# Patient Record
Sex: Male | Born: 1950 | Race: White | Hispanic: No | Marital: Married | Smoking: Never smoker
Health system: Southern US, Community
[De-identification: ages and names within clinical notes are randomized; demographics above are authoritative.]

## PROBLEM LIST (undated history)

## (undated) DIAGNOSIS — G4730 Sleep apnea, unspecified: Secondary | ICD-10-CM

## (undated) DIAGNOSIS — M1990 Unspecified osteoarthritis, unspecified site: Secondary | ICD-10-CM

## (undated) DIAGNOSIS — C801 Malignant (primary) neoplasm, unspecified: Secondary | ICD-10-CM

## (undated) DIAGNOSIS — K219 Gastro-esophageal reflux disease without esophagitis: Secondary | ICD-10-CM

## (undated) HISTORY — DX: Gastro-esophageal reflux disease without esophagitis: K21.9

## (undated) HISTORY — PX: TONSILLECTOMY: SUR1361

## (undated) HISTORY — DX: Sleep apnea, unspecified: G47.30

## (undated) HISTORY — PX: OTHER SURGICAL HISTORY: SHX169

## (undated) HISTORY — DX: Malignant (primary) neoplasm, unspecified: C80.1

## (undated) HISTORY — DX: Unspecified osteoarthritis, unspecified site: M19.90

---

## 2010-09-02 ENCOUNTER — Encounter: Payer: Self-pay | Admitting: Family Medicine

## 2010-09-02 ENCOUNTER — Ambulatory Visit (INDEPENDENT_AMBULATORY_CARE_PROVIDER_SITE_OTHER): Payer: BLUE CROSS/BLUE SHIELD | Admitting: Family Medicine

## 2010-09-02 VITALS — BP 140/86 | HR 94 | Ht 69.0 in | Wt 298.0 lb

## 2010-09-02 DIAGNOSIS — Z0000 Encounter for general adult medical examination without abnormal findings: Secondary | ICD-10-CM

## 2010-09-02 MED ORDER — OMEPRAZOLE 40 MG PO CPDR: 40.0000 mg | DELAYED_RELEASE_CAPSULE | Freq: Every day | ORAL | Status: DC

## 2010-09-02 NOTE — Progress Notes (Signed)
  Subjective:    Patient ID: Christopher Blake, male    DOB: [birth year], 60 y.o.   MRN: [id number]  HPI 60 yr old male to re-establish with [place] and for a cpx. He feels fine and has no concerns.He gets good control of his GERD with OTYC Prilosec but it is too expensive.  He knows he is overweight, but he travels a lot on his job. He eats a lot of fast food and gets no exercise.    Review of Systems  Constitutional: Negative.   HENT: Negative.   Eyes: Negative.   Respiratory: Negative.   Cardiovascular: Negative.   Gastrointestinal: Negative.   Genitourinary: Negative.   Musculoskeletal: Negative.   Skin: Negative.   Neurological: Negative.   Hematological: Negative.   Psychiatric/Behavioral: Negative.        Objective:   Physical Exam  Constitutional: He is oriented to person, place, and time. He appears well-developed and well-nourished. No distress.  HENT:  Head: Normocephalic and atraumatic.  Right Ear: External ear normal.  Left Ear: External ear normal.  Nose: Nose normal.  Mouth/Throat: Oropharynx is clear and moist. No oropharyngeal exudate.  Eyes: Conjunctivae and EOM are normal. Pupils are equal, round, and reactive to light. Right eye exhibits no discharge. Left eye exhibits no discharge. No scleral icterus.  Neck: Neck supple. No JVD present. No tracheal deviation present. No thyromegaly present.  Cardiovascular: Normal rate, regular rhythm, normal heart sounds and intact distal pulses.  Exam reveals no gallop and no friction rub.   No murmur heard.      EKG normal  Pulmonary/Chest: Effort normal and breath sounds normal. No respiratory distress. He has no wheezes. He has no rales. He exhibits no tenderness.  Abdominal: Soft. Bowel sounds are normal. He exhibits no distension and no mass. There is no tenderness. There is no rebound and no guarding.  Genitourinary: Rectum normal, prostate normal and penis normal. Guaiac negative stool. No penile tenderness.    Musculoskeletal: Normal range of motion. He exhibits no edema and no tenderness.  Lymphadenopathy:    He has no cervical adenopathy.  Neurological: He is alert and oriented to person, place, and time. He has normal reflexes. No cranial nerve deficit. He exhibits normal muscle tone. Coordination normal.  Skin: Skin is warm and dry. No rash noted. He is not diaphoretic. No erythema. No pallor.  Psychiatric: He has a normal mood and affect. His behavior is normal. Judgment and thought content normal.          Assessment & Plan:  He needs to exercise and lose weight. Wrote for Omeprazole. Recheck the BP in 6 months

## 2010-09-05 ENCOUNTER — Other Ambulatory Visit (INDEPENDENT_AMBULATORY_CARE_PROVIDER_SITE_OTHER): Payer: BLUE CROSS/BLUE SHIELD | Admitting: Family Medicine

## 2010-09-05 DIAGNOSIS — Z0000 Encounter for general adult medical examination without abnormal findings: Secondary | ICD-10-CM

## 2010-09-05 LAB — TSH: TSH: 1.74 u[IU]/mL (ref 0.35–5.50)

## 2010-09-05 LAB — BASIC METABOLIC PANEL
BUN: 17 mg/dL (ref 6–23)
Chloride: 107 mEq/L (ref 96–112)
Creatinine, Ser: 1.1 mg/dL (ref 0.4–1.5)
Glucose, Bld: 94 mg/dL (ref 70–99)
Potassium: 4.9 mEq/L (ref 3.5–5.1)

## 2010-09-05 LAB — CBC WITH DIFFERENTIAL/PLATELET
Basophils Absolute: 0 10*3/uL (ref 0.0–0.1)
Eosinophils Absolute: 0.4 10*3/uL (ref 0.0–0.7)
HCT: 43.7 % (ref 39.0–52.0)
Lymphs Abs: 2.2 10*3/uL (ref 0.7–4.0)
MCV: 88.1 fl (ref 78.0–100.0)
Monocytes Absolute: 0.8 10*3/uL (ref 0.1–1.0)
Neutrophils Relative %: 56.2 % (ref 43.0–77.0)
Platelets: 262 10*3/uL (ref 150.0–400.0)
RDW: 14.4 % (ref 11.5–14.6)

## 2010-09-05 LAB — LIPID PANEL
Cholesterol: 172 mg/dL (ref 0–200)
HDL: 38.4 mg/dL — ABNORMAL LOW (ref >39.00)
LDL Cholesterol: 115 mg/dL — ABNORMAL HIGH (ref 0–99)
Triglycerides: 91 mg/dL (ref 0.0–149.0)
VLDL: 18.2 mg/dL (ref 0.0–40.0)

## 2010-09-05 LAB — POCT URINALYSIS DIPSTICK
Blood, UA: NEGATIVE
Glucose, UA: NEGATIVE
Protein, UA: NEGATIVE
Spec Grav, UA: 1.025
Urobilinogen, UA: 0.2
pH, UA: 5

## 2010-09-05 LAB — HEPATIC FUNCTION PANEL: Total Bilirubin: 0.7 mg/dL (ref 0.3–1.2)

## 2010-09-09 NOTE — Progress Notes (Signed)
LMOM to inform Pt. 

## 2010-11-07 ENCOUNTER — Encounter: Payer: Self-pay | Admitting: Internal Medicine

## 2011-11-29 ENCOUNTER — Other Ambulatory Visit: Payer: Self-pay | Admitting: Family Medicine

## 2011-12-01 NOTE — Telephone Encounter (Signed)
Can we refill this? 

## 2012-01-26 ENCOUNTER — Encounter: Payer: Self-pay | Admitting: Family Medicine

## 2012-01-26 ENCOUNTER — Ambulatory Visit (INDEPENDENT_AMBULATORY_CARE_PROVIDER_SITE_OTHER): Payer: BLUE CROSS/BLUE SHIELD | Admitting: Family Medicine

## 2012-01-26 VITALS — BP 146/90 | HR 110 | Temp 98.5°F | Wt 300.0 lb

## 2012-01-26 DIAGNOSIS — I10 Essential (primary) hypertension: Secondary | ICD-10-CM

## 2012-01-26 MED ORDER — OMEPRAZOLE 40 MG PO CPDR: 40.0000 mg | DELAYED_RELEASE_CAPSULE | Freq: Every day | ORAL | Status: DC

## 2012-01-27 ENCOUNTER — Encounter: Payer: Self-pay | Admitting: Family Medicine

## 2012-01-27 NOTE — Progress Notes (Signed)
  Subjective:    Patient ID: Christopher Blake, male    DOB: 11/09/1950, 61 y.o.   MRN: [id number]  HPI Here to discuss elevated BP lately. He has been seeing his Orthopedist lately for a torn left knee menisus, and he has had several high BP readings in the last 2 weeks. These include a 170/100 and a 157/96. He feels fine with no HA or chest pain or SOB. We have not seen him in over a year.    Review of Systems  Constitutional: Negative.   Respiratory: Negative.   Cardiovascular: Negative.        Objective:   Physical Exam  Constitutional: He appears well-developed and well-nourished.  Neck: No thyromegaly present.  Cardiovascular: Normal rate, regular rhythm, normal heart sounds and intact distal pulses.   Pulmonary/Chest: Effort normal and breath sounds normal.  Lymphadenopathy:    He has no cervical adenopathy.          Assessment & Plan:  New diagnosis of HTN. He is overweight, and he needs to exercise and lose weight. We discussed changing his diet and severely limiting his sodium intake. We will recheck in one month with a cpx and fasting labs. We may initiate medications at that point if the BP is still high.

## 2012-02-23 ENCOUNTER — Other Ambulatory Visit (INDEPENDENT_AMBULATORY_CARE_PROVIDER_SITE_OTHER): Payer: BLUE CROSS/BLUE SHIELD

## 2012-02-23 DIAGNOSIS — Z0000 Encounter for general adult medical examination without abnormal findings: Secondary | ICD-10-CM

## 2012-02-23 LAB — BASIC METABOLIC PANEL
CO2: 23 mEq/L (ref 19–32)
Chloride: 105 mEq/L (ref 96–112)
GFR: 68.96 mL/min (ref >60.00)
Glucose, Bld: 83 mg/dL (ref 70–99)
Potassium: 4.8 mEq/L (ref 3.5–5.1)
Sodium: 136 mEq/L (ref 135–145)

## 2012-02-23 LAB — HEPATIC FUNCTION PANEL
ALT: 26 U/L (ref 0–53)
AST: 24 U/L (ref 0–37)
Albumin: 4 g/dL (ref 3.5–5.2)

## 2012-02-23 LAB — POCT URINALYSIS DIPSTICK
Blood, UA: NEGATIVE
Protein, UA: NEGATIVE
Spec Grav, UA: 1.015
Urobilinogen, UA: 0.2

## 2012-02-23 LAB — CBC WITH DIFFERENTIAL/PLATELET
Basophils Absolute: 0 10*3/uL (ref 0.0–0.1)
Eosinophils Relative: 5.4 % — ABNORMAL HIGH (ref 0.0–5.0)
HCT: 44.3 % (ref 39.0–52.0)
Hemoglobin: 14.4 g/dL (ref 13.0–17.0)
Lymphs Abs: 1.9 10*3/uL (ref 0.7–4.0)
MCV: 89.4 fl (ref 78.0–100.0)
Monocytes Absolute: 0.6 10*3/uL (ref 0.1–1.0)
Monocytes Relative: 10.7 % (ref 3.0–12.0)
Neutro Abs: 2.6 10*3/uL (ref 1.4–7.7)
RDW: 14.7 % — ABNORMAL HIGH (ref 11.5–14.6)

## 2012-02-23 LAB — LIPID PANEL
HDL: 38.1 mg/dL — ABNORMAL LOW (ref >39.00)
Total CHOL/HDL Ratio: 3

## 2012-02-23 LAB — TSH: TSH: 1.55 u[IU]/mL (ref 0.35–5.50)

## 2012-02-24 NOTE — Progress Notes (Signed)
Quick Note:  I spoke with pt ______ 

## 2012-03-02 ENCOUNTER — Encounter: Payer: Self-pay | Admitting: Family Medicine

## 2012-03-02 ENCOUNTER — Ambulatory Visit (INDEPENDENT_AMBULATORY_CARE_PROVIDER_SITE_OTHER): Payer: BLUE CROSS/BLUE SHIELD | Admitting: Family Medicine

## 2012-03-02 VITALS — BP 130/80 | HR 84 | Temp 98.6°F | Ht 69.75 in | Wt 287.0 lb

## 2012-03-02 DIAGNOSIS — Z23 Encounter for immunization: Secondary | ICD-10-CM

## 2012-03-02 DIAGNOSIS — Z0000 Encounter for general adult medical examination without abnormal findings: Secondary | ICD-10-CM

## 2012-03-02 NOTE — Progress Notes (Signed)
  Subjective:    Patient ID: Christopher Blake, male    DOB: 1950-08-28, 61 y.o.   MRN: [id number]  HPI 61 yr old male for a cpx. He feels fine and has no concerns. We met a month ago to discuss his high BP, and he has followed our advice. He has dramatically changed his diet, he has lost 13 lbs, and he feels better.    Review of Systems  Constitutional: Negative.   HENT: Negative.   Eyes: Negative.   Respiratory: Negative.   Cardiovascular: Negative.   Gastrointestinal: Negative.   Genitourinary: Negative.   Musculoskeletal: Negative.   Skin: Negative.   Neurological: Negative.   Hematological: Negative.   Psychiatric/Behavioral: Negative.        Objective:   Physical Exam  Constitutional: He is oriented to person, place, and time. He appears well-developed and well-nourished. No distress.  HENT:  Head: Normocephalic and atraumatic.  Right Ear: External ear normal.  Left Ear: External ear normal.  Nose: Nose normal.  Mouth/Throat: Oropharynx is clear and moist. No oropharyngeal exudate.  Eyes: Conjunctivae normal and EOM are normal. Pupils are equal, round, and reactive to light. Right eye exhibits no discharge. Left eye exhibits no discharge. No scleral icterus.  Neck: Neck supple. No JVD present. No tracheal deviation present. No thyromegaly present.  Cardiovascular: Normal rate, regular rhythm, normal heart sounds and intact distal pulses.  Exam reveals no gallop and no friction rub.   No murmur heard.      EKG normal   Pulmonary/Chest: Effort normal and breath sounds normal. No respiratory distress. He has no wheezes. He has no rales. He exhibits no tenderness.  Abdominal: Soft. Bowel sounds are normal. He exhibits no distension and no mass. There is no tenderness. There is no rebound and no guarding.  Genitourinary: Rectum normal, prostate normal and penis normal. Guaiac negative stool. No penile tenderness.  Musculoskeletal: Normal range of motion. He exhibits no edema and  no tenderness.  Lymphadenopathy:    He has no cervical adenopathy.  Neurological: He is alert and oriented to person, place, and time. He has normal reflexes. No cranial nerve deficit. He exhibits normal muscle tone. Coordination normal.  Skin: Skin is warm and dry. No rash noted. He is not diaphoretic. No erythema. No pallor.  Psychiatric: He has a normal mood and affect. His behavior is normal. Judgment and thought content normal.          Assessment & Plan:  Well exam. I congratulated him on his lifestyle changes. Set up a colonoscopy

## 2013-01-26 ENCOUNTER — Ambulatory Visit (INDEPENDENT_AMBULATORY_CARE_PROVIDER_SITE_OTHER): Payer: BLUE CROSS/BLUE SHIELD

## 2013-01-26 DIAGNOSIS — Z23 Encounter for immunization: Secondary | ICD-10-CM

## 2013-02-24 ENCOUNTER — Other Ambulatory Visit (INDEPENDENT_AMBULATORY_CARE_PROVIDER_SITE_OTHER): Payer: BC Managed Care – PPO

## 2013-02-24 DIAGNOSIS — Z0000 Encounter for general adult medical examination without abnormal findings: Secondary | ICD-10-CM

## 2013-02-24 LAB — BASIC METABOLIC PANEL
CO2: 26 mEq/L (ref 19–32)
Calcium: 9.6 mg/dL (ref 8.4–10.5)
GFR: 64.81 mL/min (ref >60.00)
Sodium: 139 mEq/L (ref 135–145)

## 2013-02-24 LAB — CBC WITH DIFFERENTIAL/PLATELET
Basophils Relative: 0.6 % (ref 0.0–3.0)
Hemoglobin: 15 g/dL (ref 13.0–17.0)
Lymphocytes Relative: 35.2 % (ref 12.0–46.0)
Monocytes Relative: 9.6 % (ref 3.0–12.0)
Neutro Abs: 3.3 10*3/uL (ref 1.4–7.7)
RBC: 5.15 Mil/uL (ref 4.22–5.81)

## 2013-02-24 LAB — HEPATIC FUNCTION PANEL
AST: 21 U/L (ref 0–37)
Albumin: 4.4 g/dL (ref 3.5–5.2)
Alkaline Phosphatase: 60 U/L (ref 39–117)
Total Protein: 6.8 g/dL (ref 6.0–8.3)

## 2013-02-24 LAB — POCT URINALYSIS DIPSTICK
Ketones, UA: NEGATIVE
Protein, UA: NEGATIVE
Spec Grav, UA: 1.02
pH, UA: 5.5

## 2013-02-24 LAB — LIPID PANEL
Cholesterol: 167 mg/dL (ref 0–200)
LDL Cholesterol: 109 mg/dL — ABNORMAL HIGH (ref 0–99)
VLDL: 19.6 mg/dL (ref 0.0–40.0)

## 2013-02-24 NOTE — Progress Notes (Signed)
Quick Note:  Pt has appointment on 03/03/13 will go over then. ______

## 2013-03-03 ENCOUNTER — Encounter: Payer: Self-pay | Admitting: Family Medicine

## 2013-03-03 ENCOUNTER — Ambulatory Visit (INDEPENDENT_AMBULATORY_CARE_PROVIDER_SITE_OTHER): Payer: BC Managed Care – PPO | Admitting: Family Medicine

## 2013-03-03 VITALS — BP 126/70 | HR 90 | Temp 98.6°F | Ht 69.5 in | Wt 286.0 lb

## 2013-03-03 DIAGNOSIS — Z2911 Encounter for prophylactic immunotherapy for respiratory syncytial virus (RSV): Secondary | ICD-10-CM

## 2013-03-03 DIAGNOSIS — Z23 Encounter for immunization: Secondary | ICD-10-CM

## 2013-03-03 DIAGNOSIS — Z0000 Encounter for general adult medical examination without abnormal findings: Secondary | ICD-10-CM

## 2013-03-03 MED ORDER — OMEPRAZOLE 40 MG PO CPDR: 40.0000 mg | DELAYED_RELEASE_CAPSULE | Freq: Every day | ORAL | Status: DC

## 2013-03-03 NOTE — Progress Notes (Signed)
  Subjective:    Patient ID: Christopher Blake, male    DOB: May 18, 1951, 62 y.o.   MRN: [id number]  HPI 62 yr old male for a cpx. He feels well. Since our visit a year ago he has changed his diet and lost 14 lbs. His BP is now normal.    Review of Systems  Constitutional: Negative.   HENT: Negative.   Eyes: Negative.   Respiratory: Negative.   Cardiovascular: Negative.   Gastrointestinal: Negative.   Genitourinary: Negative.   Musculoskeletal: Negative.   Skin: Negative.   Neurological: Negative.   Psychiatric/Behavioral: Negative.        Objective:   Physical Exam  Constitutional: He is oriented to person, place, and time. He appears well-developed and well-nourished. No distress.  HENT:  Head: Normocephalic and atraumatic.  Right Ear: External ear normal.  Left Ear: External ear normal.  Nose: Nose normal.  Mouth/Throat: Oropharynx is clear and moist. No oropharyngeal exudate.  Eyes: Conjunctivae and EOM are normal. Pupils are equal, round, and reactive to light. Right eye exhibits no discharge. Left eye exhibits no discharge. No scleral icterus.  Neck: Neck supple. No JVD present. No tracheal deviation present. No thyromegaly present.  Cardiovascular: Normal rate, regular rhythm, normal heart sounds and intact distal pulses.  Exam reveals no gallop and no friction rub.   No murmur heard. EKG normal   Pulmonary/Chest: Effort normal and breath sounds normal. No respiratory distress. He has no wheezes. He has no rales. He exhibits no tenderness.  Abdominal: Soft. Bowel sounds are normal. He exhibits no distension and no mass. There is no tenderness. There is no rebound and no guarding.  Genitourinary: Rectum normal, prostate normal and penis normal. Guaiac negative stool. No penile tenderness.  Musculoskeletal: Normal range of motion. He exhibits no edema and no tenderness.  Lymphadenopathy:    He has no cervical adenopathy.  Neurological: He is alert and oriented to person,  place, and time. He has normal reflexes. No cranial nerve deficit. He exhibits normal muscle tone. Coordination normal.  Skin: Skin is warm and dry. No rash noted. He is not diaphoretic. No erythema. No pallor.  Psychiatric: He has a normal mood and affect. His behavior is normal. Judgment and thought content normal.          Assessment & Plan:  Well exam. I congratulated him on losing weight. His fasting glucose is up a bit at 109, so I urged him to lose some more. Recheck one year

## 2013-04-25 ENCOUNTER — Encounter: Payer: Self-pay | Admitting: Family Medicine

## 2016-01-03 ENCOUNTER — Encounter: Payer: Self-pay | Admitting: Family Medicine

## 2016-03-11 ENCOUNTER — Telehealth: Payer: Self-pay | Admitting: Family Medicine

## 2016-03-11 NOTE — Telephone Encounter (Signed)
Can you call pt to schedule a medical clearance ( 30 minute visit) ? We received a clearance letter from [facility] and pt must be seen for this first.

## 2016-03-11 NOTE — Telephone Encounter (Signed)
Lmw/w for pt to call back and schedule appt.

## 2016-03-11 NOTE — Telephone Encounter (Signed)
Pt scheduled  

## 2016-03-12 ENCOUNTER — Encounter: Payer: Self-pay | Admitting: Family Medicine

## 2016-03-12 ENCOUNTER — Ambulatory Visit (INDEPENDENT_AMBULATORY_CARE_PROVIDER_SITE_OTHER): Payer: BLUE CROSS/BLUE SHIELD | Admitting: Family Medicine

## 2016-03-12 VITALS — BP 144/96 | HR 78 | Temp 98.6°F | Ht 69.5 in | Wt 296.0 lb

## 2016-03-12 DIAGNOSIS — Z01818 Encounter for other preprocedural examination: Secondary | ICD-10-CM

## 2016-03-12 LAB — POC URINALSYSI DIPSTICK (AUTOMATED)
Bilirubin, UA: NEGATIVE
Blood, UA: NEGATIVE
Glucose, UA: NEGATIVE
Ketones, UA: NEGATIVE
Leukocytes, UA: NEGATIVE
Nitrite, UA: NEGATIVE
Protein, UA: NEGATIVE
SPEC GRAV UA: 1.025
UROBILINOGEN UA: 0.2
pH, UA: 5.5

## 2016-03-12 NOTE — Progress Notes (Signed)
Pre visit review using our clinic review tool, if applicable. No additional management support is needed unless otherwise documented below in the visit note. 

## 2016-03-13 ENCOUNTER — Encounter: Payer: Self-pay | Admitting: Family Medicine

## 2016-03-13 LAB — CBC WITH DIFFERENTIAL/PLATELET
BASOS PCT: 0.2 % (ref 0.0–3.0)
Basophils Absolute: 0 10*3/uL (ref 0.0–0.1)
EOS ABS: 0.4 10*3/uL (ref 0.0–0.7)
Eosinophils Relative: 3.1 % (ref 0.0–5.0)
HCT: 47.1 % (ref 39.0–52.0)
HEMOGLOBIN: 15.7 g/dL (ref 13.0–17.0)
Lymphocytes Relative: 20.7 % (ref 12.0–46.0)
Lymphs Abs: 2.4 10*3/uL (ref 0.7–4.0)
MCHC: 33.4 g/dL (ref 30.0–36.0)
MCV: 88.3 fl (ref 78.0–100.0)
MONO ABS: 0.9 10*3/uL (ref 0.1–1.0)
Monocytes Relative: 7.9 % (ref 3.0–12.0)
Neutro Abs: 8 10*3/uL — ABNORMAL HIGH (ref 1.4–7.7)
Neutrophils Relative %: 68.1 % (ref 43.0–77.0)
PLATELETS: 308 10*3/uL (ref 150.0–400.0)
RBC: 5.34 Mil/uL (ref 4.22–5.81)
RDW: 14.5 % (ref 11.5–15.5)
WBC: 11.7 10*3/uL — AB (ref 4.0–10.5)

## 2016-03-13 LAB — HEPATIC FUNCTION PANEL
ALT: 19 U/L (ref 0–53)
AST: 19 U/L (ref 0–37)
Albumin: 4.5 g/dL (ref 3.5–5.2)
Alkaline Phosphatase: 73 U/L (ref 39–117)
BILIRUBIN TOTAL: 0.3 mg/dL (ref 0.2–1.2)
Bilirubin, Direct: 0.1 mg/dL (ref 0.0–0.3)
TOTAL PROTEIN: 7.4 g/dL (ref 6.0–8.3)

## 2016-03-13 LAB — BASIC METABOLIC PANEL
BUN: 18 mg/dL (ref 6–23)
CHLORIDE: 105 meq/L (ref 96–112)
CO2: 27 meq/L (ref 19–32)
CREATININE: 1.26 mg/dL (ref 0.40–1.50)
Calcium: 10 mg/dL (ref 8.4–10.5)
GFR: 61.24 mL/min (ref >60.00)
GLUCOSE: 93 mg/dL (ref 70–99)
Potassium: 4.3 mEq/L (ref 3.5–5.1)
Sodium: 141 mEq/L (ref 135–145)

## 2016-03-13 LAB — TSH: TSH: 1.69 u[IU]/mL (ref 0.35–4.50)

## 2016-03-13 NOTE — Progress Notes (Signed)
   Subjective:    Patient ID: Christopher Blake, male    DOB: 05-08-1951, 65 y.o.   MRN: [id number]  HPI 65 yr old male to re-establish and for a preoperative exam. He is scheduled for a right knee arthroscopy on 04-02-16 per Dr. Esmond Plants. This is to repair a torn meniscus. Other than knee pain he has no concerns.    Review of Systems  Constitutional: Negative.   HENT: Negative.   Eyes: Negative.   Respiratory: Negative.   Cardiovascular: Negative.   Gastrointestinal: Negative.   Genitourinary: Negative.   Musculoskeletal: Negative.   Skin: Negative.   Neurological: Negative.   Psychiatric/Behavioral: Negative.        Objective:   Physical Exam  Constitutional: He is oriented to person, place, and time. He appears well-developed and well-nourished. No distress.  HENT:  Head: Normocephalic and atraumatic.  Right Ear: External ear normal.  Left Ear: External ear normal.  Nose: Nose normal.  Mouth/Throat: Oropharynx is clear and moist. No oropharyngeal exudate.  Eyes: Conjunctivae and EOM are normal. Pupils are equal, round, and reactive to light. Right eye exhibits no discharge. Left eye exhibits no discharge. No scleral icterus.  Neck: Neck supple. No JVD present. No tracheal deviation present. No thyromegaly present.  Cardiovascular: Normal rate, regular rhythm, normal heart sounds and intact distal pulses.  Exam reveals no gallop and no friction rub.   No murmur heard. EKG is normal   Pulmonary/Chest: Effort normal and breath sounds normal. No respiratory distress. He has no wheezes. He has no rales. He exhibits no tenderness.  Abdominal: Soft. Bowel sounds are normal. He exhibits no distension and no mass. There is no tenderness. There is no rebound and no guarding.  Musculoskeletal: Normal range of motion. He exhibits no edema or tenderness.  Lymphadenopathy:    He has no cervical adenopathy.  Neurological: He is alert and oriented to person, place, and time. He has  normal reflexes. No cranial nerve deficit. He exhibits normal muscle tone. Coordination normal.  Skin: Skin is warm and dry. No rash noted. He is not diaphoretic. No erythema. No pallor.          Assessment & Plan:  Preoperative exam. Although his BP is a little high we will clear him for the procedure, pending the results of labs we will draw today. We discussed the need for him to lose weight. He will return after the procedure to follow up on the BP and for further assessment.  Laurey Morale, MD

## 2016-04-02 HISTORY — PX: KNEE ARTHROSCOPY W/ MENISCAL REPAIR: SHX1877

## 2016-04-21 ENCOUNTER — Encounter: Payer: Self-pay | Admitting: Gastroenterology

## 2016-04-21 ENCOUNTER — Ambulatory Visit (INDEPENDENT_AMBULATORY_CARE_PROVIDER_SITE_OTHER): Payer: BLUE CROSS/BLUE SHIELD | Admitting: Family Medicine

## 2016-04-21 ENCOUNTER — Telehealth: Payer: Self-pay | Admitting: Family Medicine

## 2016-04-21 ENCOUNTER — Other Ambulatory Visit: Payer: Self-pay | Admitting: Family Medicine

## 2016-04-21 ENCOUNTER — Encounter: Payer: Self-pay | Admitting: Family Medicine

## 2016-04-21 VITALS — BP 142/98 | Temp 98.3°F | Ht 69.5 in | Wt 301.0 lb

## 2016-04-21 DIAGNOSIS — N433 Hydrocele, unspecified: Secondary | ICD-10-CM | POA: Diagnosis not present

## 2016-04-21 DIAGNOSIS — K4090 Unilateral inguinal hernia, without obstruction or gangrene, not specified as recurrent: Secondary | ICD-10-CM

## 2016-04-21 DIAGNOSIS — Z0000 Encounter for general adult medical examination without abnormal findings: Secondary | ICD-10-CM | POA: Diagnosis not present

## 2016-04-21 DIAGNOSIS — G4733 Obstructive sleep apnea (adult) (pediatric): Secondary | ICD-10-CM

## 2016-04-21 LAB — PSA: PSA: 0.72 ng/mL (ref 0.10–4.00)

## 2016-04-21 LAB — LIPID PANEL
CHOL/HDL RATIO: 4
CHOLESTEROL: 170 mg/dL (ref 0–200)
HDL: 44.6 mg/dL (ref >39.00)
LDL CALC: 108 mg/dL — AB (ref 0–99)
NonHDL: 125.7
TRIGLYCERIDES: 91 mg/dL (ref 0.0–149.0)
VLDL: 18.2 mg/dL (ref 0.0–40.0)

## 2016-04-21 MED ORDER — OMEPRAZOLE 40 MG PO CPDR: 40.0000 mg | DELAYED_RELEASE_CAPSULE | Freq: Every day | ORAL | 3 refills | Status: DC

## 2016-04-21 NOTE — Addendum Note (Signed)
Addended by: Aggie Hacker A on: 04/21/2016 01:26 PM   Modules accepted: Orders

## 2016-04-21 NOTE — Telephone Encounter (Signed)
Message from [place], we must add a doppler of scrotum to existing order, code is [id number], per Dr. Sarajane Jews okay to order, it is now protocol. I did put in order and spoke with imaging.

## 2016-04-21 NOTE — Progress Notes (Signed)
   Subjective:    Patient ID: Christopher Blake, male    DOB: 05-09-1951, 65 y.o.   MRN: [id number]  HPI 65 yr old male for a well exam. He feels fine but he asks if he may have sleep apnea. His wife says he snores quite loudly and stops breathing for short periods. He had arthroscopy on the right knee on 04-02-16 and this went well except for an infection around some of the sutures. He is taking an antibiotic for 10 days (he does not recall the name of it). He has never had a colonoscopy.    Review of Systems  Constitutional: Negative.   HENT: Negative.   Eyes: Negative.   Respiratory: Negative.   Cardiovascular: Negative.   Gastrointestinal: Negative.   Genitourinary: Negative.   Musculoskeletal: Negative.   Skin: Negative.   Neurological: Negative.   Psychiatric/Behavioral: Negative.        Objective:   Physical Exam  Constitutional: He is oriented to person, place, and time. He appears well-developed and well-nourished. No distress.  HENT:  Head: Normocephalic and atraumatic.  Right Ear: External ear normal.  Left Ear: External ear normal.  Nose: Nose normal.  Mouth/Throat: Oropharynx is clear and moist. No oropharyngeal exudate.  Eyes: Conjunctivae and EOM are normal. Pupils are equal, round, and reactive to light. Right eye exhibits no discharge. Left eye exhibits no discharge. No scleral icterus.  Neck: Neck supple. No JVD present. No tracheal deviation present. No thyromegaly present.  Cardiovascular: Normal rate, regular rhythm, normal heart sounds and intact distal pulses.  Exam reveals no gallop and no friction rub.   No murmur heard. Pulmonary/Chest: Effort normal and breath sounds normal. No respiratory distress. He has no wheezes. He has no rales. He exhibits no tenderness.  Abdominal: Soft. Bowel sounds are normal. He exhibits no distension and no mass. There is no tenderness. There is no rebound and no guarding.  Genitourinary: Rectum normal, prostate normal and  penis normal. Rectal exam shows guaiac negative stool. No penile tenderness.  Genitourinary Comments: The right scrotum has a large fluid collection consistent with a hydrocele. No tenderness. Both testicles are normal.   Musculoskeletal: Normal range of motion. He exhibits no edema or tenderness.  Lymphadenopathy:    He has no cervical adenopathy.  Neurological: He is alert and oriented to person, place, and time. He has normal reflexes. No cranial nerve deficit. He exhibits normal muscle tone. Coordination normal.  Skin: Skin is warm and dry. No rash noted. He is not diaphoretic. No erythema. No pallor.  Psychiatric: He has a normal mood and affect. His behavior is normal. Judgment and thought content normal.          Assessment & Plan:  Well exam. We will supplement the labs he had drawn in October with a lipid panel and a PSA today. Set up a colonoscopy. Refer to Pulmonary for a sleep study. Set up a scrotal US.  Laurey Morale, MD

## 2016-04-21 NOTE — Progress Notes (Signed)
Pre visit review using our clinic review tool, if applicable. No additional management support is needed unless otherwise documented below in the visit note. 

## 2016-04-25 ENCOUNTER — Ambulatory Visit
Admission: RE | Admit: 2016-04-25 | Discharge: 2016-04-25 | Disposition: A | Discharge status: Home / Self Care | Payer: BLUE CROSS/BLUE SHIELD | Source: Ambulatory Visit | Attending: Family Medicine | Admitting: Family Medicine

## 2016-04-25 DIAGNOSIS — N433 Hydrocele, unspecified: Secondary | ICD-10-CM

## 2016-04-28 NOTE — Addendum Note (Signed)
Addended by: Alysia Penna A on: 04/28/2016 05:29 PM   Modules accepted: Orders

## 2016-06-23 ENCOUNTER — Encounter: Payer: Self-pay | Admitting: Pulmonary Disease

## 2016-06-23 ENCOUNTER — Ambulatory Visit (AMBULATORY_SURGERY_CENTER): Payer: Self-pay | Admitting: *Deleted

## 2016-06-23 ENCOUNTER — Ambulatory Visit (INDEPENDENT_AMBULATORY_CARE_PROVIDER_SITE_OTHER): Payer: BLUE CROSS/BLUE SHIELD | Admitting: Pulmonary Disease

## 2016-06-23 VITALS — BP 156/88 | HR 105 | Ht 70.0 in | Wt 304.2 lb

## 2016-06-23 VITALS — Ht 70.0 in | Wt 304.6 lb

## 2016-06-23 DIAGNOSIS — IMO0001 Reserved for inherently not codable concepts without codable children: Secondary | ICD-10-CM

## 2016-06-23 DIAGNOSIS — G4710 Hypersomnia, unspecified: Secondary | ICD-10-CM | POA: Diagnosis not present

## 2016-06-23 DIAGNOSIS — Z1211 Encounter for screening for malignant neoplasm of colon: Secondary | ICD-10-CM

## 2016-06-23 DIAGNOSIS — E669 Obesity, unspecified: Secondary | ICD-10-CM | POA: Diagnosis not present

## 2016-06-23 DIAGNOSIS — Z6841 Body Mass Index (BMI) 40.0 and over, adult: Secondary | ICD-10-CM

## 2016-06-23 MED ORDER — NA SULFATE-K SULFATE-MG SULF 17.5-3.13-1.6 GM/177ML PO SOLN: ORAL | 0 refills | Status: DC

## 2016-06-23 NOTE — Patient Instructions (Signed)

## 2016-06-23 NOTE — Progress Notes (Signed)
   Subjective:    Patient ID: Christopher Blake, male    DOB: 01-27-1951, 66 y.o.   MRN: [id number]  HPI    Review of Systems  Constitutional: Negative.   HENT: Negative.  Negative for congestion, dental problem, ear pain, nosebleeds, postnasal drip, rhinorrhea, sinus pressure, sneezing, sore throat and trouble swallowing.   Eyes: Negative.  Negative for redness and itching.  Respiratory: Negative.  Negative for cough, chest tightness, shortness of breath and wheezing.   Cardiovascular: Negative.  Negative for palpitations and leg swelling.  Gastrointestinal: Negative.  Negative for nausea and vomiting.  Endocrine: Negative.   Genitourinary: Negative.  Negative for dysuria.  Musculoskeletal: Negative.  Negative for joint swelling.  Skin: Negative.  Negative for rash.  Allergic/Immunologic: Negative.   Neurological: Negative.  Negative for headaches.  Hematological: Negative.  Does not bruise/bleed easily.  Psychiatric/Behavioral: Negative.  Negative for dysphoric mood. The patient is not nervous/anxious.        Objective:   Physical Exam        Assessment & Plan:

## 2016-06-23 NOTE — Assessment & Plan Note (Signed)
Weight reduction 

## 2016-06-23 NOTE — Progress Notes (Signed)
Subjective:    Patient ID: Christopher Blake, male    DOB: [birth year], 66 y.o.   MRN: [id number]  HPI   This is the case of Christopher Blake, 66 y.o. Male, who was referred by Dr. Alysia Penna  in consultation regarding OSA.   As you very well know, patient is a non smoker, not known to have asthma or copd. Patient has snoring, gasping, choking, unrefreshed sleep, hypersomnia, fatigue.  Occasional witnessed apneas. Sleeps 5-6 hrs / night. Wakes up unrefreshed in am. Naps in weekend.   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 He travels frequently for work [place] and [place].   (-) abnormal behavior in sleep.   ESS 17.   Review of Systems  Constitutional: Negative.   HENT: Negative.   Eyes: Negative.   Respiratory: Negative.   Cardiovascular: Negative.   Gastrointestinal: Negative.   Endocrine: Negative.   Genitourinary: Negative.   Musculoskeletal: Negative.   Skin: Negative.   Allergic/Immunologic: Negative.   Neurological: Negative.   Hematological: Negative.   Psychiatric/Behavioral: Negative.   All other systems reviewed and are negative.  Past Medical History:  Diagnosis Date  . GERD (gastroesophageal reflux disease)    (-) CAD, CA, DVT  Family History  Problem Relation Age of Onset  . Arthritis Father   . Heart disease Father   . Hypertension Father   . Heart disease Mother   . Diabetes Mother   . COPD Mother   . Colon cancer Neg Hx      Past Surgical History:  Procedure Laterality Date  . KNEE ARTHROSCOPY W/ MENISCAL REPAIR Right 04/02/2016  . TONSILLECTOMY      Social History   Social History  . Marital status: Married    Spouse name: N/A  . Number of children: N/A  . Years of education: N/A   Occupational History  . Not on file.   Social History Main Topics  . Smoking status: Never Smoker  . Smokeless tobacco: Never Used  . Alcohol use No  . Drug use: No  . Sexual activity: Not on file   Other Topics Concern  .  Not on file   Social History Narrative  . No narrative on file   Married with 1 son, works as Engineer, technical sales.   No Known Allergies   Outpatient Medications Prior to Visit  Medication Sig Dispense Refill  . aspirin 325 MG tablet Take 325 mg by mouth as needed.    . fish oil-omega-3 fatty acids 1000 MG capsule Take 2 g by mouth daily.    Marland Kitchen ibuprofen (ADVIL,MOTRIN) 200 MG tablet Take 200 mg by mouth every 6 (six) hours as needed.    . Multiple Vitamin (MULTIVITAMIN) tablet Take 1 tablet by mouth daily.    Marland Kitchen omeprazole (PRILOSEC) 40 MG capsule Take 1 capsule (40 mg total) by mouth daily. 90 capsule 3  . Na Sulfate-K Sulfate-Mg Sulf 17.5-3.13-1.6 GM/180ML SOLN Suprep no substitutions 354 mL 0   No facility-administered medications prior to visit.    No orders of the defined types were placed in this encounter.       Objective:   Physical Exam  Vitals:  Vitals:   06/23/16 1522  BP: (!) 156/88  Pulse: (!) 105  SpO2: 96%  Weight: (!) 304 lb 3.2 oz (138 kg)  Height: 5\' 10"  (1.778 m)    Constitutional/General:  Pleasant, well-nourished, well-developed, not in any distress,  Comfortably seating.  Well kempt  Body mass index is 43.65 kg/m. Wt Readings from Last 3 Encounters:  06/23/16 (!) 304 lb 3.2 oz (138 kg)  06/23/16 (!) 304 lb 9.6 oz (138.2 kg)  04/21/16 (!) 301 lb (136.5 kg)    Neck circumference: 19 in   HEENT: Pupils equal and reactive to light and accommodation. Anicteric sclerae. Normal nasal mucosa.   No oral  lesions,  mouth clear,  oropharynx clear, no postnasal drip. (-) Oral thrush. No dental caries.  Airway - Mallampati class III  Neck: No masses. Midline trachea. No JVD, (-) LAD. (-) bruits appreciated.  Respiratory/Chest: Grossly normal chest. (-) deformity. (-) Accessory muscle use.  Symmetric expansion. (-) Tenderness on palpation.  Resonant on percussion.  Diminished BS on both lower lung zones. (-) wheezing, crackles, rhonchi (-)  egophony  Cardiovascular: Regular rate and  rhythm, heart sounds normal, no murmur or gallops, no peripheral edema  Gastrointestinal:  Normal bowel sounds. Soft, non-tender. No hepatosplenomegaly.  (-) masses.   Musculoskeletal:  Normal muscle tone. Normal gait.   Extremities: Grossly normal. (-) clubbing, cyanosis.  (-) edema  Skin: (-) rash,lesions seen.   Neurological/Psychiatric : alert, oriented to time, place, person. Normal mood and affect           Assessment & Plan:  Hypersomnia As you very well know, patient is a non smoker, not known to have asthma or copd. Patient has snoring, gasping, choking, unrefreshed sleep, hypersomnia, fatigue.  Occasional witnessed apneas. Sleeps 5-6 hrs / night. Wakes up unrefreshed in am. Naps in weekend.   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 He travels frequently for work [place] and [place].   (-) abnormal behavior in sleep.   ESS 17.   Plan :   We discussed about the diagnosis of Obstructive Sleep Apnea (OSA) and implications of untreated OSA. We discussed about CPAP and BiPaP as possible treatment options.    We will schedule the patient for a sleep study. Patient wanted to do a home sleep test. He 66 but is healthy. He plans on working until he is 3. He has friends who use CPAP. Anticipate no issues. Likely moderate. He also travels a lot. May need travel CPAP.   Patient was instructed to call the office if he/she has not heard back from the office 1-2 weeks after the sleep study.   Patient was instructed to call the office if he/she is having issues with the PAP device.   We discussed good sleep hygiene.   Patient was advised not to engage in activities requiring concentration and/or vigilance if he/she is sleepy.  Patient was advised not to drive if he/she is sleepy.    Obesity Weight reduction     Thank you very much for letting me participate in this patient's care. Please do not  hesitate to give me a call if you have any questions or concerns regarding the treatment plan.   Patient will follow up with me in 8-10 weeks.     Monica Becton, MD 06/23/2016   4:17 PM [facility] [place] Pager: [phone] Office: [phone], Fax: [phone]

## 2016-06-23 NOTE — Progress Notes (Signed)
Patient denies allergies to eggs and soy. Patient is not on home O2. No problem with anesthesia. Emmi sent to email.

## 2016-06-23 NOTE — Assessment & Plan Note (Signed)
As you very well know, patient is a non smoker, not known to have asthma or copd. Patient has snoring, gasping, choking, unrefreshed sleep, hypersomnia, fatigue.  Occasional witnessed apneas. Sleeps 5-6 hrs / night. Wakes up unrefreshed in am. Naps in weekend.   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 He travels frequently for work [place] and [place].   (-) abnormal behavior in sleep.   ESS 17.   Plan :   We discussed about the diagnosis of Obstructive Sleep Apnea (OSA) and implications of untreated OSA. We discussed about CPAP and BiPaP as possible treatment options.    We will schedule the patient for a sleep study. Patient wanted to do a home sleep test. He 66 but is healthy. He plans on working until he is 67. He has friends who use CPAP. Anticipate no issues. Likely moderate. He also travels a lot. May need travel CPAP.   Patient was instructed to call the office if he/she has not heard back from the office 1-2 weeks after the sleep study.   Patient was instructed to call the office if he/she is having issues with the PAP device.   We discussed good sleep hygiene.   Patient was advised not to engage in activities requiring concentration and/or vigilance if he/she is sleepy.  Patient was advised not to drive if he/she is sleepy.

## 2016-07-03 DIAGNOSIS — G4733 Obstructive sleep apnea (adult) (pediatric): Secondary | ICD-10-CM | POA: Diagnosis not present

## 2016-07-07 ENCOUNTER — Other Ambulatory Visit: Payer: Self-pay | Admitting: Pulmonary Disease

## 2016-07-07 ENCOUNTER — Encounter: Payer: BLUE CROSS/BLUE SHIELD | Admitting: Gastroenterology

## 2016-07-07 ENCOUNTER — Telehealth: Payer: Self-pay | Admitting: Pulmonary Disease

## 2016-07-07 DIAGNOSIS — G4733 Obstructive sleep apnea (adult) (pediatric): Secondary | ICD-10-CM

## 2016-07-07 NOTE — Telephone Encounter (Signed)
    Please call the pt and tell the pt the [place]  showed OSA   Pt stops breathing 36    times an hour.   Home sleep study was done on : 07/03/16  Please order autoCPAP 5-15 cm H2O. Patient will need a mask fitting session. Patient will need a 1 month download.   Patient needs to be seen by me or any of the NPs/APPs  4-6 weeks after obtaining the cpap machine. Let me know if you receive this.   Thanks!   J. Shirl Harris, MD 07/07/2016, 2:31 PM

## 2016-07-07 NOTE — Telephone Encounter (Signed)
Spoke with patient and informed him of hst results. Pt was informed that he will have a CPAP masking as well as be receiving a CPAP machine. The patient was instructed to contact office once he receives his machine to schedule a follow up appointment. The patient did not have any questions. The orders were placed for the CPAP machine and mask fitting. Nothing further is needed.

## 2016-07-08 DIAGNOSIS — G4733 Obstructive sleep apnea (adult) (pediatric): Secondary | ICD-10-CM

## 2016-07-09 ENCOUNTER — Other Ambulatory Visit: Payer: Self-pay | Admitting: *Deleted

## 2016-07-09 DIAGNOSIS — G4710 Hypersomnia, unspecified: Secondary | ICD-10-CM

## 2016-07-30 ENCOUNTER — Telehealth: Payer: Self-pay | Admitting: Pulmonary Disease

## 2016-07-30 NOTE — Telephone Encounter (Signed)
Spoke with wife informed her the order was sent to [facility] but due to it being after hours will have to follow up with them tomorrow.

## 2016-07-31 NOTE — Telephone Encounter (Signed)
Nira Conn called back - advised that pt is ready to be scheduled and she has tried to contact him - she can be reached [phone] -pr

## 2016-07-31 NOTE — Telephone Encounter (Signed)
Spoke with pt's wife, Gregary Signs. She has been given Heather's contact information at [employer]. Nothing further was needed at this time.

## 2016-07-31 NOTE — Telephone Encounter (Signed)
Called [facility] at [phone] and left VM to find out why pt hasnt been contacted about CPAP start.

## 2016-08-04 ENCOUNTER — Other Ambulatory Visit (HOSPITAL_BASED_OUTPATIENT_CLINIC_OR_DEPARTMENT_OTHER): Payer: BLUE CROSS/BLUE SHIELD

## 2016-08-11 ENCOUNTER — Encounter: Payer: BLUE CROSS/BLUE SHIELD | Admitting: Gastroenterology

## 2016-08-18 ENCOUNTER — Ambulatory Visit: Payer: BLUE CROSS/BLUE SHIELD | Admitting: Pulmonary Disease

## 2016-08-19 ENCOUNTER — Encounter: Payer: Self-pay | Admitting: Gastroenterology

## 2016-08-25 ENCOUNTER — Ambulatory Visit (HOSPITAL_BASED_OUTPATIENT_CLINIC_OR_DEPARTMENT_OTHER): Payer: BLUE CROSS/BLUE SHIELD | Attending: Pulmonary Disease | Admitting: Radiology

## 2016-08-25 DIAGNOSIS — G4733 Obstructive sleep apnea (adult) (pediatric): Secondary | ICD-10-CM

## 2016-09-01 ENCOUNTER — Ambulatory Visit (AMBULATORY_SURGERY_CENTER): Payer: BLUE CROSS/BLUE SHIELD | Admitting: Gastroenterology

## 2016-09-01 ENCOUNTER — Encounter: Payer: Self-pay | Admitting: Gastroenterology

## 2016-09-01 VITALS — BP 132/85 | HR 77 | Temp 97.8°F | Resp 15 | Ht 70.0 in | Wt 304.0 lb

## 2016-09-01 DIAGNOSIS — D126 Benign neoplasm of colon, unspecified: Secondary | ICD-10-CM

## 2016-09-01 DIAGNOSIS — Z1211 Encounter for screening for malignant neoplasm of colon: Secondary | ICD-10-CM | POA: Diagnosis present

## 2016-09-01 DIAGNOSIS — K621 Rectal polyp: Secondary | ICD-10-CM

## 2016-09-01 DIAGNOSIS — K635 Polyp of colon: Secondary | ICD-10-CM

## 2016-09-01 DIAGNOSIS — D128 Benign neoplasm of rectum: Secondary | ICD-10-CM | POA: Diagnosis not present

## 2016-09-01 DIAGNOSIS — D120 Benign neoplasm of cecum: Secondary | ICD-10-CM

## 2016-09-01 DIAGNOSIS — D122 Benign neoplasm of ascending colon: Secondary | ICD-10-CM | POA: Diagnosis not present

## 2016-09-01 DIAGNOSIS — Z1212 Encounter for screening for malignant neoplasm of rectum: Secondary | ICD-10-CM | POA: Diagnosis not present

## 2016-09-01 DIAGNOSIS — D127 Benign neoplasm of rectosigmoid junction: Secondary | ICD-10-CM

## 2016-09-01 DIAGNOSIS — D129 Benign neoplasm of anus and anal canal: Secondary | ICD-10-CM

## 2016-09-01 MED ORDER — SODIUM CHLORIDE 0.9 % IV SOLN: 500.0000 mL | INTRAVENOUS | Status: DC

## 2016-09-01 NOTE — Progress Notes (Signed)
Pt's states no medical or surgical changes since previsit or office visit. 

## 2016-09-01 NOTE — Op Note (Signed)
[facility] Patient Name: Christopher Blake Procedure Date: 09/01/2016 2:09 PM MRN: [id number] Endoscopist: Remo Lipps P. Misael Mcgaha MD, MD Age: 66 Referring MD:  Date of Birth: [birth year] Gender: Male Account #: [id number] Procedure:                Colonoscopy Indications:              Screening for colorectal malignant neoplasm, This                            is the patient's first colonoscopy Medicines:                Monitored Anesthesia Care Procedure:                Pre-Anesthesia Assessment:                           - Prior to the procedure, a History and Physical                            was performed, and patient medications and                            allergies were reviewed. The patient's tolerance of                            previous anesthesia was also reviewed. The risks                            and benefits of the procedure and the sedation                            options and risks were discussed with the patient.                            All questions were answered, and informed consent                            was obtained. Prior Anticoagulants: The patient has                            taken aspirin, last dose was 1 day prior to                            procedure. ASA Grade Assessment: II - A patient                            with mild systemic disease. After reviewing the                            risks and benefits, the patient was deemed in                            satisfactory condition to undergo the procedure.  After obtaining informed consent, the colonoscope                            was passed under direct vision. Throughout the                            procedure, the patient's blood pressure, pulse, and                            oxygen saturations were monitored continuously. The                            Colonoscope was introduced through the anus and                            advanced to the the  cecum, identified by                            appendiceal orifice and ileocecal valve. The                            colonoscopy was performed without difficulty. The                            patient tolerated the procedure well. The quality                            of the bowel preparation was adequate. The                            ileocecal valve, appendiceal orifice, and rectum                            were photographed. Scope In: 2:12:54 PM Scope Out: 2:33:04 PM Scope Withdrawal Time: 0 hours 18 minutes 2 seconds  Total Procedure Duration: 0 hours 20 minutes 10 seconds  Findings:                 The perianal and digital rectal examinations were                            normal.                           A large amount of liquid stool was found in the                            entire colon, making visualization difficult. Time                            was spent to lavage of the colon was performed                            using copious amounts of sterile water, resulting  in clearance with adequate visualization.                           A 4 mm polyp was found in the cecum. The polyp was                            sessile. The polyp was removed with a cold snare.                            Resection and retrieval were complete.                           Five sessile polyps were found in the ascending                            colon. The polyps were 4 to 7 mm in size. These                            polyps were removed with a cold snare. Resection                            and retrieval were complete.                           Three flat polyps were found in the recto-sigmoid                            colon. The polyps were 3 to 4 mm in size. These                            polyps were removed with a cold snare. Resection                            and retrieval were complete.                           A 4 mm polyp was found in the  rectum. The polyp was                            sessile. The polyp was removed with a cold snare.                            Resection and retrieval were complete.                           Many medium-mouthed diverticula were found in the                            entire colon.                           Internal hemorrhoids were found during retroflexion.  The exam was otherwise without abnormality. Complications:            No immediate complications. Estimated blood loss:                            Minimal. Estimated Blood Loss:     Estimated blood loss was minimal. Impression:               - Stool in the entire examined colon. Time taken to                            lavage to achieve adequate views.                           - One 4 mm polyp in the cecum, removed with a cold                            snare. Resected and retrieved.                           - Five 4 to 7 mm polyps in the ascending colon,                            removed with a cold snare. Resected and retrieved.                           - Three 3 to 4 mm polyps at the recto-sigmoid                            colon, removed with a cold snare. Resected and                            retrieved.                           - One 4 mm polyp in the rectum, removed with a cold                            snare. Resected and retrieved.                           - Diverticulosis in the entire examined colon.                           - Internal hemorrhoids.                           - The examination was otherwise normal. Recommendation:           - Patient has a contact number available for                            emergencies. The signs and symptoms of potential  delayed complications were discussed with the                            patient. Return to normal activities tomorrow.                            Written discharge instructions were provided to the                             patient.                           - Resume previous diet.                           - Continue present medications.                           - No aspirin, ibuprofen, naproxen, or other                            non-steroidal anti-inflammatory drugs for 2 weeks                            after polyp removal.                           - Await pathology results.                           - Repeat colonoscopy is recommended for                            surveillance. The colonoscopy date will be                            determined after pathology results from today's                            exam become available for review. Remo Lipps P. Eden Rho MD, MD 09/01/2016 2:40:37 PM This report has been signed electronically.

## 2016-09-01 NOTE — Progress Notes (Signed)
Called to room to assist during endoscopic procedure.  Patient ID and intended procedure confirmed with present staff. Received instructions for my participation in the procedure from the performing physician.  

## 2016-09-01 NOTE — Patient Instructions (Signed)
Colon polyps removed today. Handouts given on polyps,diverticulosis, hemorrhoids. Please do not take any ibuprofen, naproxen, or any other non-steriodal anti-inflammatory medications for 2 weeks!! Result letter in your mail in 2-3 weeks. Call us with any questions or concerns. Thank you!  YOU HAD AN ENDOSCOPIC PROCEDURE TODAY AT [facility]:   Refer to the procedure report that was given to you for any specific questions about what was found during the examination.  If the procedure report does not answer your questions, please call your gastroenterologist to clarify.  If you requested that your care partner not be given the details of your procedure findings, then the procedure report has been included in a sealed envelope for you to review at your convenience later.  YOU SHOULD EXPECT: Some feelings of bloating in the abdomen. Passage of more gas than usual.  Walking can help get rid of the air that was put into your GI tract during the procedure and reduce the bloating. If you had a lower endoscopy (such as a colonoscopy or flexible sigmoidoscopy) you may notice spotting of blood in your stool or on the toilet paper. If you underwent a bowel prep for your procedure, you may not have a normal bowel movement for a few days.  Please Note:  You might notice some irritation and congestion in your nose or some drainage.  This is from the oxygen used during your procedure.  There is no need for concern and it should clear up in a day or so.  SYMPTOMS TO REPORT IMMEDIATELY:   Following lower endoscopy (colonoscopy or flexible sigmoidoscopy):  Excessive amounts of blood in the stool  Significant tenderness or worsening of abdominal pains  Swelling of the abdomen that is new, acute  Fever of 100F or higher   For urgent or emergent issues, a gastroenterologist can be reached at any hour by calling [phone].   DIET:  We do recommend a small meal at first, but then you may  proceed to your regular diet.  Drink plenty of fluids but you should avoid alcoholic beverages for 24 hours.  ACTIVITY:  You should plan to take it easy for the rest of today and you should NOT DRIVE or use heavy machinery until tomorrow (because of the sedation medicines used during the test).    FOLLOW UP: Our staff will call the number listed on your records the next business day following your procedure to check on you and address any questions or concerns that you may have regarding the information given to you following your procedure. If we do not reach you, we will leave a message.  However, if you are feeling well and you are not experiencing any problems, there is no need to return our call.  We will assume that you have returned to your regular daily activities without incident.  If any biopsies were taken you will be contacted by phone or by letter within the next 1-3 weeks.  Please call us at [phone] if you have not heard about the biopsies in 3 weeks.    SIGNATURES/CONFIDENTIALITY: You and/or your care partner have signed paperwork which will be entered into your electronic medical record.  These signatures attest to the fact that that the information above on your After Visit Summary has been reviewed and is understood.  Full responsibility of the confidentiality of this discharge information lies with you and/or your care-partner.

## 2016-09-01 NOTE — Progress Notes (Signed)
To Pacu VSS. Report to RN 

## 2016-09-02 ENCOUNTER — Telehealth: Payer: Self-pay

## 2016-09-02 NOTE — Telephone Encounter (Signed)
  Follow up Call-  Call back number 09/01/2016  Post procedure Call Back phone  # [phone]  Permission to leave phone message Yes  Some recent data might be hidden     Patient questions:  Do you have a fever, pain , or abdominal swelling? No. Pain Score  0 *  Have you tolerated food without any problems? Yes.    Have you been able to return to your normal activities? Yes.    Do you have any questions about your discharge instructions: Diet   No. Medications  No. Follow up visit  No.  Do you have questions or concerns about your Care? No.  Actions: * If pain score is 4 or above: No action needed, pain <4.

## 2016-09-08 ENCOUNTER — Encounter: Payer: Self-pay | Admitting: Gastroenterology

## 2016-10-02 ENCOUNTER — Encounter: Payer: Self-pay | Admitting: Pulmonary Disease

## 2016-10-03 ENCOUNTER — Encounter: Payer: Self-pay | Admitting: Pulmonary Disease

## 2016-10-03 ENCOUNTER — Ambulatory Visit (INDEPENDENT_AMBULATORY_CARE_PROVIDER_SITE_OTHER): Payer: BLUE CROSS/BLUE SHIELD | Admitting: Pulmonary Disease

## 2016-10-03 DIAGNOSIS — G4733 Obstructive sleep apnea (adult) (pediatric): Secondary | ICD-10-CM | POA: Diagnosis not present

## 2016-10-03 NOTE — Patient Instructions (Signed)
   It was a pleasure taking care of you today!  Continue using your CPAP machine.  We will order you a mask fitting session.   Please make sure you use your CPAP device everytime you sleep.  We will monitor the usage of your machine per your insurance requirement.  Your insurance company may take the machine from you if you are not using it regularly.   Please clean the mask, tubings, filter, water reservoir with soapy water every week.  Please use distilled water for the water reservoir.   Please call the office or your machine provider (DME company) if you are having issues with the device.   Return to clinic in 6 weeks   With NP/APP

## 2016-10-03 NOTE — Assessment & Plan Note (Addendum)
As you very well know, patient is a non smoker, not known to have asthma or copd. Patient has snoring, gasping, choking, unrefreshed sleep, hypersomnia, fatigue.  Occasional witnessed apneas. Sleeps 5-6 hrs / night. Wakes up unrefreshed in am. Naps in weekend.   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 He travels frequently for work [place] and [place].   (-) abnormal behavior in sleep.   ESS 17.   Patient had a home sleep study in February which showed AHI of 36. He was started on auto CPAP 5-15 centimeters water. He had a mask fit with Lynnae Sandhoff  and has a full face mask. He is struggling with this mask. He sleeps on his side and  the mask moves a lot. He has a lot of leak issues. Download the last month: 50%, AHI 8.5.  Plan :   We extensively discussed the importance of treating OSA and the need to use PAP therapy.   Continue with autocpap 5-15 cm water. He had a mask fit with Lynnae Sandhoff and he currently has a FFM.  He sleeps on his side a lot and the mask moves a lot and he ends up having a lot of leak. Plan for a mask fitting session with his DME company. We need to get a 1 month download with a new mask and he needs to be compliant. If his AHI remains elevated with the new mask,  He will  need to have a CPAP titration study.    Patient was instructed to have mask, tubings, filter, reservoir cleaned at least once a week with soapy water.  Patient was instructed to call the office if he/she is having issues with the PAP device.    I advised patient to obtain sufficient amount of sleep --  7 to 8 hours at least in a 24 hr period.  Patient was advised to follow good sleep hygiene.  Patient was advised NOT to engage in activities requiring concentration and/or vigilance if he/she is and  sleepy.  Patient is NOT to drive if he/she is sleepy.

## 2016-10-03 NOTE — Progress Notes (Signed)
Subjective:    Patient ID: Christopher Blake, male    DOB: Dec 10, 1950, 66 y.o.   MRN: [id number]  HPI   This is the case of Christopher Blake, 66 y.o. Male, who was referred by Dr. Alysia Penna  in consultation regarding OSA.   As you very well know, patient is a non smoker, not known to have asthma or copd. Patient has snoring, gasping, choking, unrefreshed sleep, hypersomnia, fatigue.  Occasional witnessed apneas. Sleeps 5-6 hrs / night. Wakes up unrefreshed in am. Naps in weekend.   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 He travels frequently for work [place] and [place].   (-) abnormal behavior in sleep.   ESS 17.   ROV 10/03/16 Patient returns to the office as follow-up on his sleep apnea. Since last seen, he had a home sleep study which showed an AHI of 36. He was started on an auto CPAP machine. Download last month: 3%, AHI 8.5. He had a mask fit session with Lynnae Sandhoff. He has a full face mask. The issue with the mask is he usually sleeps on his side and it moves a lot during the night. He isn't comfortable with this mask. Other than that, his sleepiness is better with CPAP.  Review of Systems  Constitutional: Negative.   HENT: Negative.   Eyes: Negative.   Respiratory: Negative.   Cardiovascular: Negative.   Gastrointestinal: Negative.   Endocrine: Negative.   Genitourinary: Negative.   Musculoskeletal: Negative.   Skin: Negative.   Allergic/Immunologic: Negative.   Neurological: Negative.   Hematological: Negative.   Psychiatric/Behavioral: Negative.   All other systems reviewed and are negative.      Objective:   Physical Exam  Vitals:  Vitals:   10/03/16 1020  BP: 128/88  Pulse: 78  SpO2: 96%  Weight: (!) 300 lb 9.6 oz (136.4 kg)  Height: 5\' 10"  (1.778 m)    Constitutional/General:  Pleasant, well-nourished, well-developed, not in any distress,  Comfortably seating.  Well kempt  Body mass index is 43.13 kg/m. Wt Readings from Last 3  Encounters:  10/03/16 (!) 300 lb 9.6 oz (136.4 kg)  09/01/16 (!) 304 lb (137.9 kg)  06/23/16 (!) 304 lb 3.2 oz (138 kg)    Neck circumference: 19 in   HEENT: Pupils equal and reactive to light and accommodation. Anicteric sclerae. Normal nasal mucosa.   No oral  lesions,  mouth clear,  oropharynx clear, no postnasal drip. (-) Oral thrush. No dental caries.  Airway - Mallampati class III  Neck: No masses. Midline trachea. No JVD, (-) LAD. (-) bruits appreciated.  Respiratory/Chest: Grossly normal chest. (-) deformity. (-) Accessory muscle use.  Symmetric expansion. (-) Tenderness on palpation.  Resonant on percussion.  Diminished BS on both lower lung zones. (-) wheezing, crackles, rhonchi (-) egophony  Cardiovascular: Regular rate and  rhythm, heart sounds normal, no murmur or gallops, no peripheral edema  Gastrointestinal:  Normal bowel sounds. Soft, non-tender. No hepatosplenomegaly.  (-) masses.   Musculoskeletal:  Normal muscle tone. Normal gait.   Extremities: Grossly normal. (-) clubbing, cyanosis.  (-) edema  Skin: (-) rash,lesions seen.   Neurological/Psychiatric : alert, oriented to time, place, person. Normal mood and affect           Assessment & Plan:  OSA (obstructive sleep apnea) As you very well know, patient is a non smoker, not known to have asthma or copd. Patient has snoring, gasping, choking, unrefreshed sleep, hypersomnia, fatigue.  Occasional witnessed apneas. Sleeps 5-6 hrs / night. Wakes up unrefreshed in am. Naps in weekend.   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 He travels frequently for work [place] and [place].   (-) abnormal behavior in sleep.   ESS 17.   Patient had a home sleep study in February which showed AHI of 36. He was started on auto CPAP 5-15 centimeters water. He had a mask fit with Lynnae Sandhoff  and has a full face mask. He is struggling with this mask. He sleeps on his side and  the mask moves a  lot. He has a lot of leak issues. Download the last month: 50%, AHI 8.5.  Plan :   We extensively discussed the importance of treating OSA and the need to use PAP therapy.   Continue with autocpap 5-15 cm water. He had a mask fit with Lynnae Sandhoff and he currently has a FFM.  He sleeps on his side a lot and the mask moves a lot and he ends up having a lot of leak. Plan for a mask fitting session with his DME company. We need to get a 1 month download with a new mask and he needs to be compliant. If his AHI remains elevated with the new mask,  He will  need to have a CPAP titration study.    Patient was instructed to have mask, tubings, filter, reservoir cleaned at least once a week with soapy water.  Patient was instructed to call the office if he/she is having issues with the PAP device.    I advised patient to obtain sufficient amount of sleep --  7 to 8 hours at least in a 24 hr period.  Patient was advised to follow good sleep hygiene.  Patient was advised NOT to engage in activities requiring concentration and/or vigilance if he/she is and  sleepy.  Patient is NOT to drive if he/she is sleepy.      Patient will follow up  6 weeks.     Monica Becton, MD 10/03/2016   10:55 AM [facility] [place] Pager: [phone] Office: [phone], Fax: [phone]

## 2016-11-24 ENCOUNTER — Ambulatory Visit (INDEPENDENT_AMBULATORY_CARE_PROVIDER_SITE_OTHER): Payer: BLUE CROSS/BLUE SHIELD | Admitting: Adult Health

## 2016-11-24 ENCOUNTER — Encounter: Payer: Self-pay | Admitting: Adult Health

## 2016-11-24 DIAGNOSIS — G4733 Obstructive sleep apnea (adult) (pediatric): Secondary | ICD-10-CM

## 2016-11-24 DIAGNOSIS — E669 Obesity, unspecified: Secondary | ICD-10-CM | POA: Diagnosis not present

## 2016-11-24 DIAGNOSIS — Z6841 Body Mass Index (BMI) 40.0 and over, adult: Secondary | ICD-10-CM | POA: Diagnosis not present

## 2016-11-24 DIAGNOSIS — IMO0001 Reserved for inherently not codable concepts without codable children: Secondary | ICD-10-CM

## 2016-11-24 NOTE — Progress Notes (Signed)
@Patient  ID: Christopher Blake, male    DOB: 18-Mar-1951, 66 y.o.   MRN: [id number]  Chief Complaint  Patient presents with  . Follow-up    OSA     Referring provider: Laurey Morale, MD  HPI: 66 year old male followed for sleep apnea  TEST  HST 06/2016 AHI 36/hr   11/24/2016 Follow up : OSA  Patient returns for a two-month follow-up. Patient has known severe sleep apnea. Patient is maintained on C Pap at bedtime. Says he is doing well on C Pap and feels rested with no significant daytime sleepiness. Download shows excellent compliance with average usage at 7 hours. Patient is on AutoSet 5-15 cm H2O. AHI 3.5. + leaks.  He did recently get a new mask that is feeling better. Feels much better now .       No Known Allergies  Immunization History  Administered Date(s) Administered  . Influenza Split 03/02/2012  . Influenza,inj,Quad PF,36+ Mos 01/26/2013  . Influenza-Unspecified 02/23/2016  . Zoster 03/03/2013    Past Medical History:  Diagnosis Date  . GERD (gastroesophageal reflux disease)     Tobacco History: History  Smoking Status  . Never Smoker  Smokeless Tobacco  . Never Used   Counseling given: Not Answered   Outpatient Encounter Prescriptions as of 11/24/2016  Medication Sig  . Acetaminophen (TYLENOL 8 HOUR PO) Take by mouth.  Marland Kitchen aspirin 325 MG tablet Take 325 mg by mouth as needed.  . fish oil-omega-3 fatty acids 1000 MG capsule Take 2 g by mouth daily.  Marland Kitchen ibuprofen (ADVIL,MOTRIN) 200 MG tablet Take 200 mg by mouth every 6 (six) hours as needed.  . Multiple Vitamin (MULTIVITAMIN) tablet Take 1 tablet by mouth daily.  Marland Kitchen omeprazole (PRILOSEC) 40 MG capsule Take 1 capsule (40 mg total) by mouth daily.   Facility-Administered Encounter Medications as of 11/24/2016  Medication  . 0.9 %  sodium chloride infusion     Review of Systems  Constitutional:   No  weight loss, night sweats,  Fevers, chills, fatigue, or  lassitude.  HEENT:   No headaches,   Difficulty swallowing,  Tooth/dental problems, or  Sore throat,                No sneezing, itching, ear ache, nasal congestion, post nasal drip,   CV:  No chest pain,  Orthopnea, PND, swelling in lower extremities, anasarca, dizziness, palpitations, syncope.   GI  No heartburn, indigestion, abdominal pain, nausea, vomiting, diarrhea, change in bowel habits, loss of appetite, bloody stools.   Resp: No shortness of breath with exertion or at rest.  No excess mucus, no productive cough,  No non-productive cough,  No coughing up of blood.  No change in color of mucus.  No wheezing.  No chest wall deformity  Skin: no rash or lesions.  GU: no dysuria, change in color of urine, no urgency or frequency.  No flank pain, no hematuria   MS:  No joint pain or swelling.  No decreased range of motion.  No back pain.    Physical Exam  BP 118/78 (BP Location: Left Arm, Patient Position: Sitting, Cuff Size: Large)   Pulse 81   Ht 5\' 10"  (1.778 m)   Wt (!) 303 lb 3.2 oz (137.5 kg)   SpO2 94%   BMI 43.50 kg/m    GEN: A/Ox3; pleasant , NAD , obese    HEENT:  [place]/AT,  EACs-clear, TMs-wnl, NOSE-clear, THROAT-clear, no lesions, no postnasal drip or exudate noted. Class 2-3  MP airway   NECK:  Supple w/ fair ROM; no JVD; normal carotid impulses w/o bruits; no thyromegaly or nodules palpated; no lymphadenopathy.    RESP  Clear  P & A; w/o, wheezes/ rales/ or rhonchi. no accessory muscle use, no dullness to percussion  CARD:  RRR, no m/r/g, no peripheral edema, pulses intact, no cyanosis or clubbing.  GI:   Soft & nt; nml bowel sounds; no organomegaly or masses detected.   Musco: Warm bil, no deformities or joint swelling noted.   Neuro: alert, no focal deficits noted.    Skin: Warm, no lesions or rashes    Lab Results:    BNP No results found for: BNP  ProBNP No results found for: PROBNP  Imaging: No results found.   Assessment & Plan:   OSA (obstructive sleep apnea) Well  controlled on CPAP   Plan  Patient Instructions  Continue on C Pap at bedtime Wear C Pap at least for 6 hours each night. Work on weight loss Do not drive a sleepy Follow-up in one year with Dr. Halford Chessman and As needed      Obesity Wt loss      Rexene Edison, NP 11/24/2016

## 2016-11-24 NOTE — Assessment & Plan Note (Signed)
Well controlled on CPAP   Plan  Patient Instructions  Continue on C Pap at bedtime Wear C Pap at least for 6 hours each night. Work on weight loss Do not drive a sleepy Follow-up in one year with Dr. Halford Blake and As needed

## 2016-11-24 NOTE — Assessment & Plan Note (Signed)
Wt loss  

## 2016-11-24 NOTE — Progress Notes (Signed)
I have reviewed and agree with assessment/plan.  Chesley Mires, MD [facility] 11/24/2016, 12:08 PM Pager:  [phone]

## 2016-11-24 NOTE — Addendum Note (Signed)
Addended by: Parke Poisson E on: 11/24/2016 10:38 AM   Modules accepted: Orders

## 2016-11-24 NOTE — Patient Instructions (Signed)
Continue on C Pap at bedtime Wear C Pap at least for 6 hours each night. Work on weight loss Do not drive a sleepy Follow-up in one year with Dr. Halford Chessman and As needed

## 2017-02-05 ENCOUNTER — Encounter: Payer: Self-pay | Admitting: Family Medicine

## 2017-03-30 ENCOUNTER — Other Ambulatory Visit: Payer: Self-pay | Admitting: Family Medicine

## 2017-04-23 IMAGING — US US ART/VEN ABD/PELV/SCROTUM DOPPLER LTD
1 series · 14 of 25 positions shown · non-contrast
Comparison: None.

CLINICAL DATA: Right-sided scrotal swelling, possible hydrocele

EXAM:
SCROTAL ULTRASOUND
DOPPLER ULTRASOUND OF THE TESTICLES
TECHNIQUE: Complete ultrasound examination of the testicles, epididymis, and
other scrotal structures was performed. Color and spectral Doppler
ultrasound were also utilized to evaluate blood flow to the
testicles.

[Series 1: us art/ven abd/pelv/scrotum doppler ltd · 40 acquisitions, 14 frames shown]
[im 1/40]
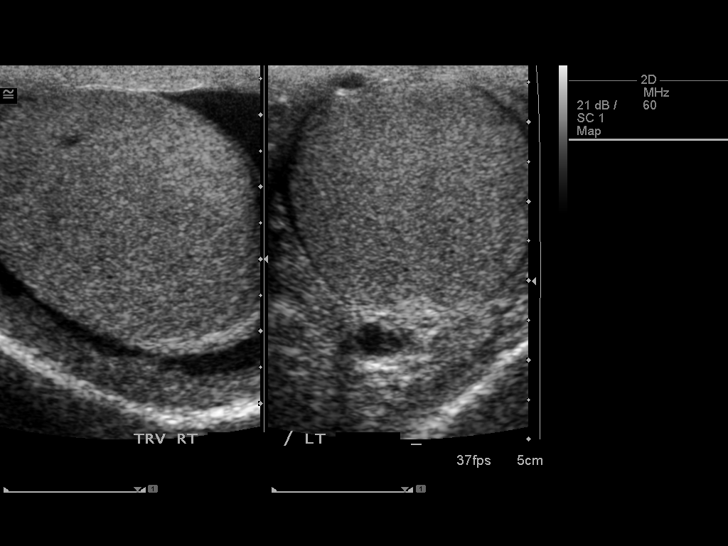
[im 4/40]
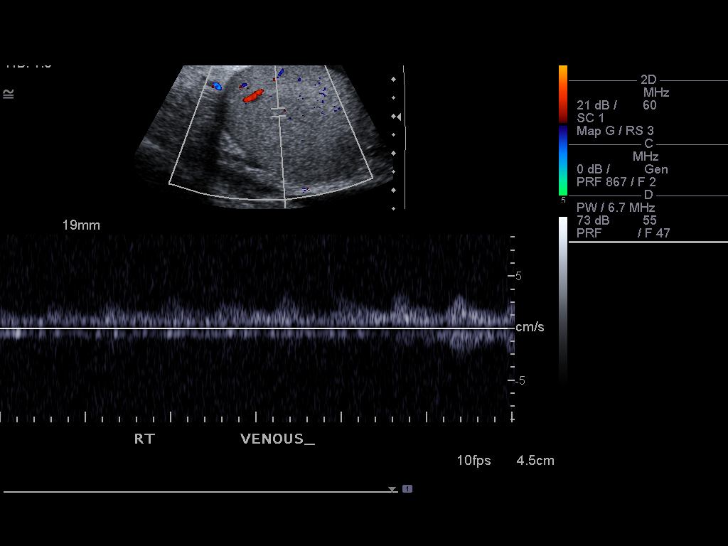
[im 7/40]
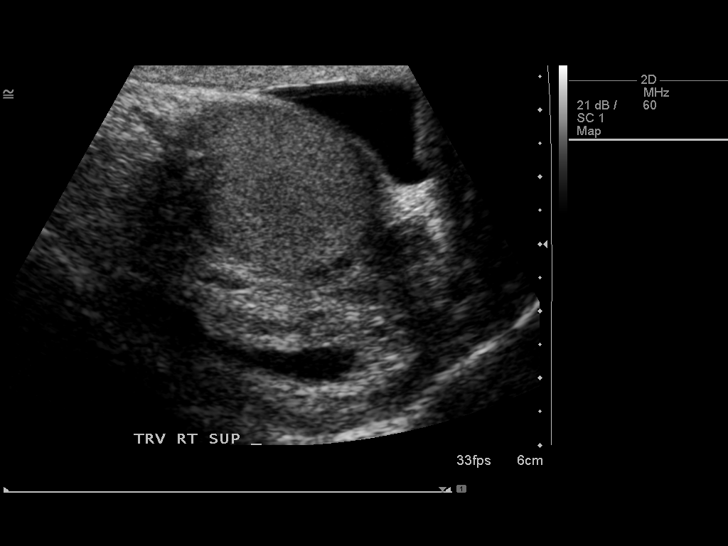
[im 10/40]
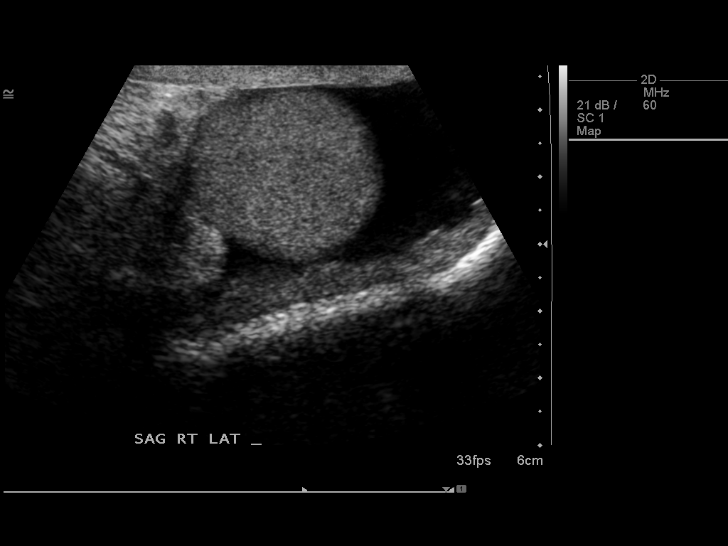
[im 14/40]
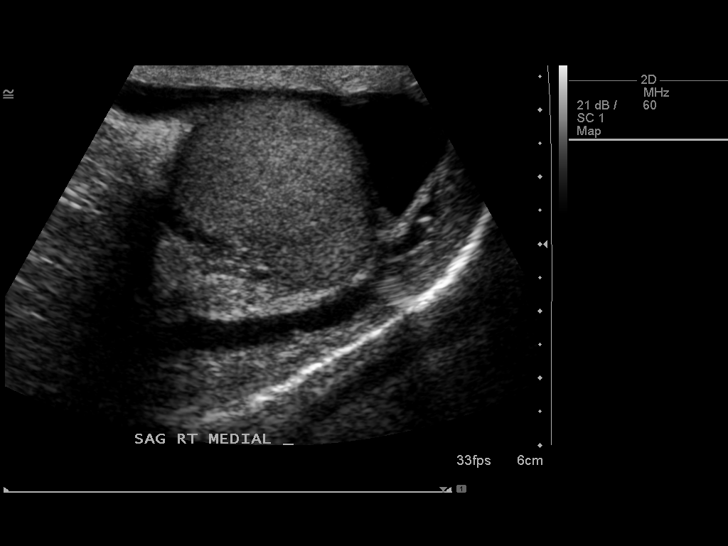
[im 15/40]
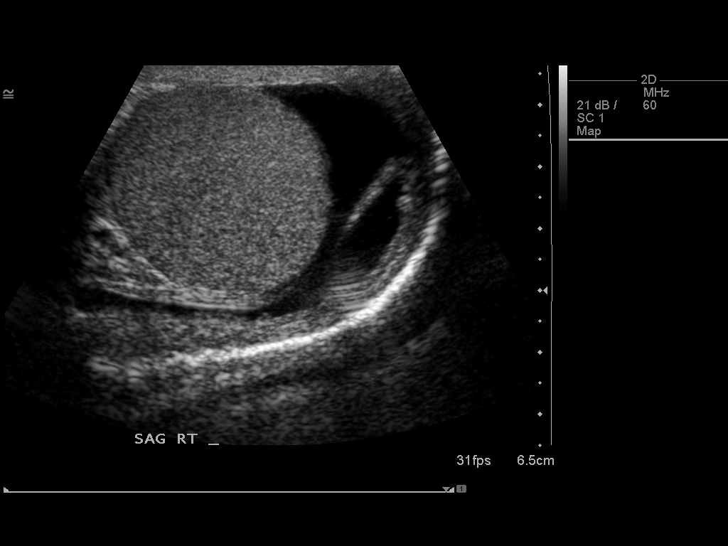
[im 18/40]
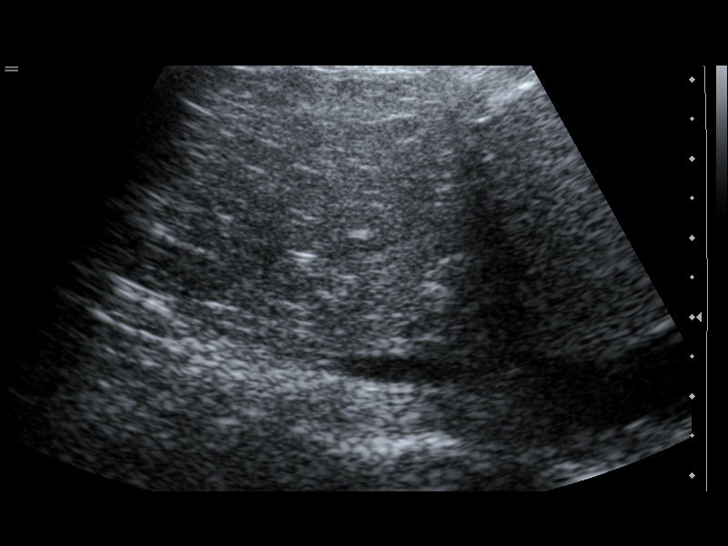
[im 22/40]
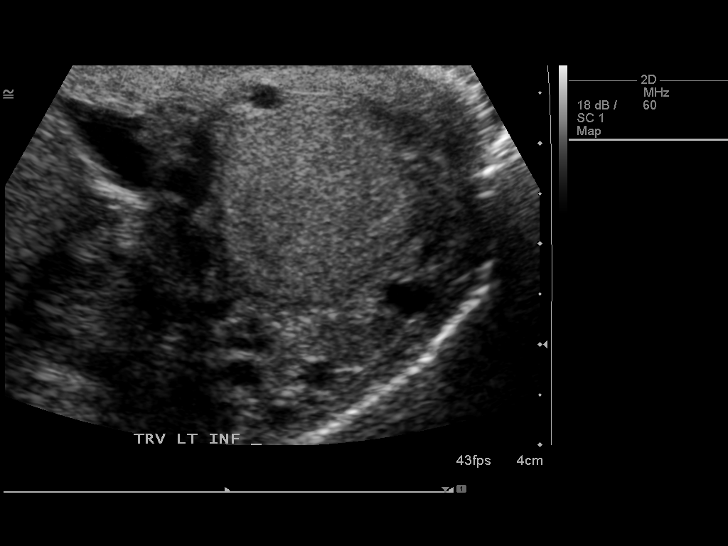
[im 25/40]
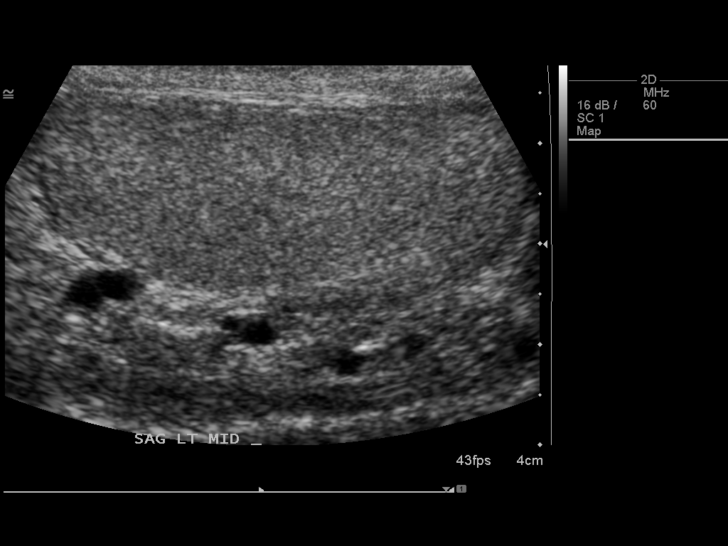
[im 27/40]
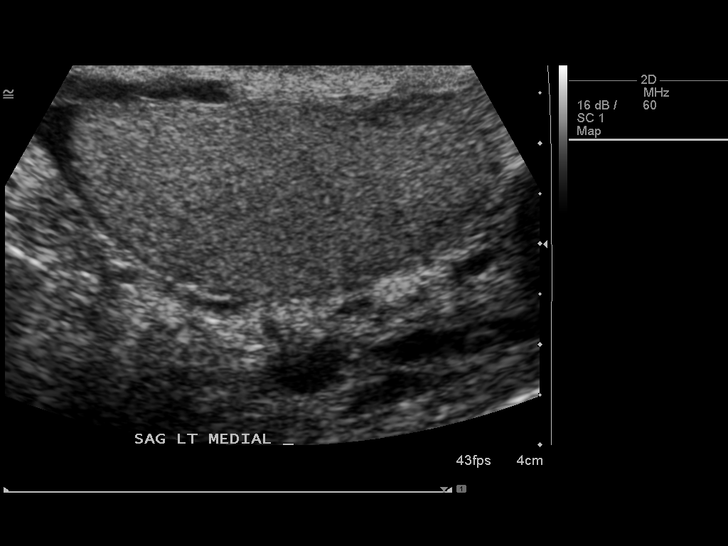
[im 30/40]
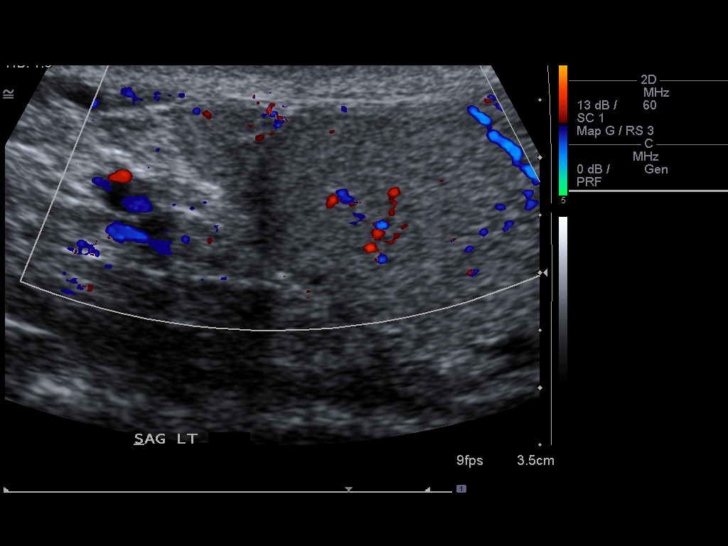
[im 33/40]
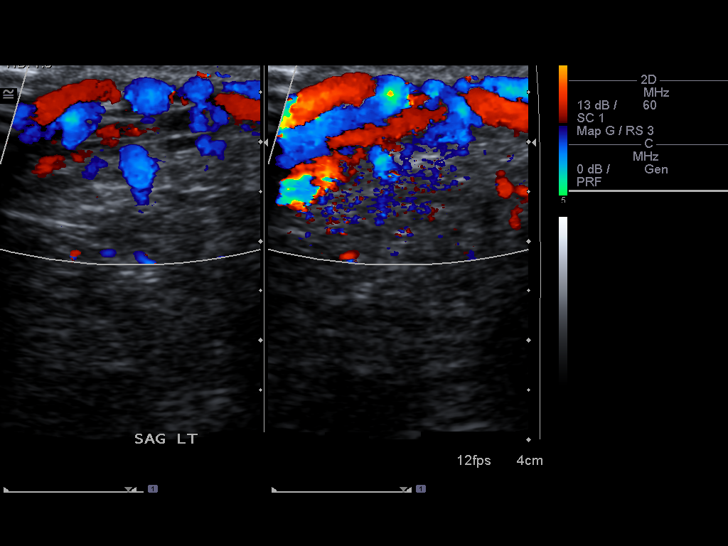
[im 36/40]
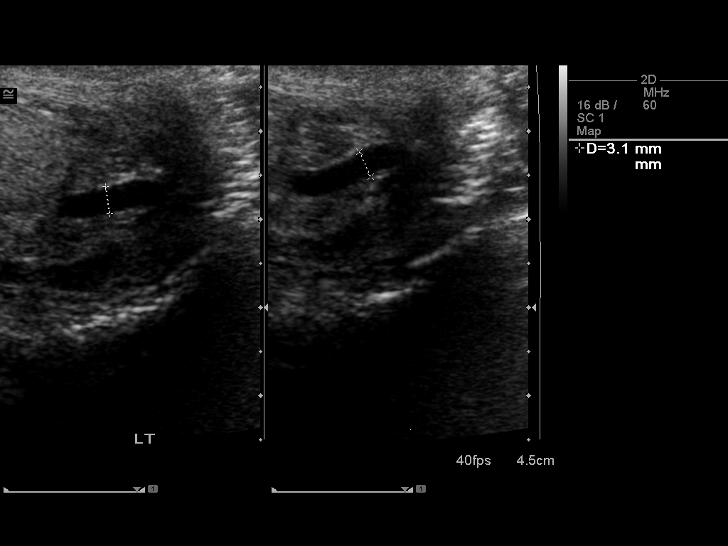
[im 40/40]
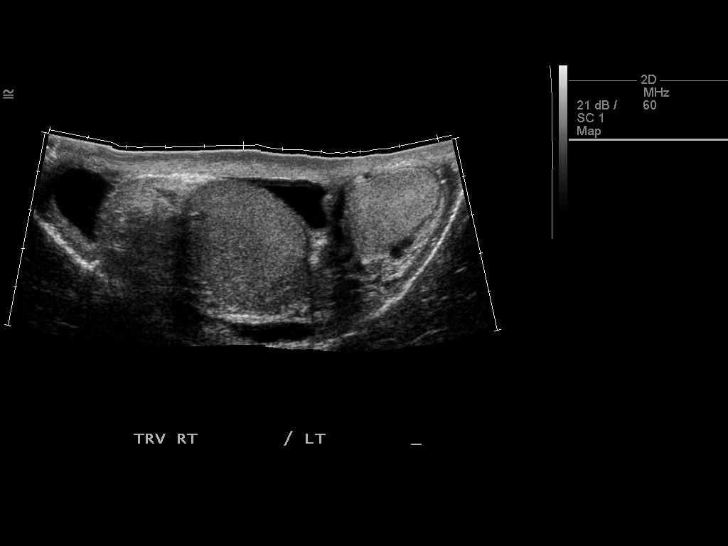

[14 of 25 positions shown; findings below may reference images not displayed]

FINDINGS: Right testicle

Measurements: 3.6 x 3.3 x 3.4 cm.. No mass or microlithiasis
visualized. Area of increased echogenicity is noted superior to the
right testicle suggestive of inguinal hernia.

Left testicle

Measurements: 5.0 x 2.0 x 3.2 cm.. No mass or microlithiasis
visualized.

Right epididymis:  Not well visualized.

Left epididymis:  Normal in size and appearance.

Hydrocele: Small right hydrocele is noted. This is predominantly
inferiorly noted.

Varicocele:  None visualized.

Pulsed Doppler interrogation of both testes demonstrates normal low
resistance arterial and venous waveforms bilaterally.
IMPRESSION: Small right hydrocele.

Findings consistent with a right inguinal hernia which corresponds
to the area of palpable abnormality.

Normal-appearing testicles.

## 2017-08-06 DIAGNOSIS — G4733 Obstructive sleep apnea (adult) (pediatric): Secondary | ICD-10-CM | POA: Diagnosis not present

## 2017-09-03 DIAGNOSIS — C44319 Basal cell carcinoma of skin of other parts of face: Secondary | ICD-10-CM | POA: Diagnosis not present

## 2017-09-03 DIAGNOSIS — D485 Neoplasm of uncertain behavior of skin: Secondary | ICD-10-CM | POA: Diagnosis not present

## 2017-09-03 DIAGNOSIS — L980 Pyogenic granuloma: Secondary | ICD-10-CM | POA: Diagnosis not present

## 2017-09-03 DIAGNOSIS — Z85828 Personal history of other malignant neoplasm of skin: Secondary | ICD-10-CM | POA: Diagnosis not present

## 2017-09-26 ENCOUNTER — Other Ambulatory Visit: Payer: Self-pay | Admitting: Family Medicine

## 2017-09-28 DIAGNOSIS — Z85828 Personal history of other malignant neoplasm of skin: Secondary | ICD-10-CM | POA: Diagnosis not present

## 2017-09-28 DIAGNOSIS — C44319 Basal cell carcinoma of skin of other parts of face: Secondary | ICD-10-CM | POA: Diagnosis not present

## 2017-12-22 DIAGNOSIS — Z85828 Personal history of other malignant neoplasm of skin: Secondary | ICD-10-CM | POA: Diagnosis not present

## 2017-12-22 DIAGNOSIS — I8312 Varicose veins of left lower extremity with inflammation: Secondary | ICD-10-CM | POA: Diagnosis not present

## 2017-12-22 DIAGNOSIS — C44319 Basal cell carcinoma of skin of other parts of face: Secondary | ICD-10-CM | POA: Diagnosis not present

## 2017-12-22 DIAGNOSIS — D485 Neoplasm of uncertain behavior of skin: Secondary | ICD-10-CM | POA: Diagnosis not present

## 2017-12-22 DIAGNOSIS — D1801 Hemangioma of skin and subcutaneous tissue: Secondary | ICD-10-CM | POA: Diagnosis not present

## 2017-12-22 DIAGNOSIS — B078 Other viral warts: Secondary | ICD-10-CM | POA: Diagnosis not present

## 2017-12-22 DIAGNOSIS — L980 Pyogenic granuloma: Secondary | ICD-10-CM | POA: Diagnosis not present

## 2017-12-22 DIAGNOSIS — I872 Venous insufficiency (chronic) (peripheral): Secondary | ICD-10-CM | POA: Diagnosis not present

## 2017-12-22 DIAGNOSIS — L812 Freckles: Secondary | ICD-10-CM | POA: Diagnosis not present

## 2017-12-22 DIAGNOSIS — I8311 Varicose veins of right lower extremity with inflammation: Secondary | ICD-10-CM | POA: Diagnosis not present

## 2018-01-08 ENCOUNTER — Ambulatory Visit: Payer: PPO | Admitting: Pulmonary Disease

## 2018-01-26 DIAGNOSIS — Z85828 Personal history of other malignant neoplasm of skin: Secondary | ICD-10-CM | POA: Diagnosis not present

## 2018-01-26 DIAGNOSIS — C44319 Basal cell carcinoma of skin of other parts of face: Secondary | ICD-10-CM | POA: Diagnosis not present

## 2018-02-27 DIAGNOSIS — Z23 Encounter for immunization: Secondary | ICD-10-CM | POA: Diagnosis not present

## 2018-03-10 ENCOUNTER — Ambulatory Visit (INDEPENDENT_AMBULATORY_CARE_PROVIDER_SITE_OTHER): Payer: PPO | Admitting: Pulmonary Disease

## 2018-03-10 ENCOUNTER — Encounter: Payer: Self-pay | Admitting: Pulmonary Disease

## 2018-03-10 VITALS — BP 112/84 | HR 71 | Ht 68.0 in | Wt 314.4 lb

## 2018-03-10 DIAGNOSIS — E669 Obesity, unspecified: Secondary | ICD-10-CM | POA: Diagnosis not present

## 2018-03-10 DIAGNOSIS — G4730 Sleep apnea, unspecified: Secondary | ICD-10-CM

## 2018-03-10 DIAGNOSIS — G4733 Obstructive sleep apnea (adult) (pediatric): Secondary | ICD-10-CM | POA: Diagnosis not present

## 2018-03-10 NOTE — Patient Instructions (Signed)
Follow up in 1 year.

## 2018-03-10 NOTE — Progress Notes (Signed)
[facility]  Chief Complaint  Patient presents with  . Follow-up    Yearly    Constitutional:  BP 112/84 (BP Location: Right Arm, Patient Position: Sitting, Cuff Size: Normal)   Pulse 71   Ht 5\' 8"  (1.727 m)   Wt (!) 314 lb 6.4 oz (142.6 kg)   SpO2 96%   BMI 47.80 kg/m   Past Medical History:  GERD  Brief Summary:  Christopher Blake is a 67 y.o. male with obstructive sleep apnea.  He was previously seen by Dr. Corrie Dandy.  Home sleep study from 2018 showed severe sleep apnea.  Uses CPAP nightly.  Has nasal cushion mask.  This fits well.  Didn't do well when he tried full face mask.  No issue with pressure.  Denies sinus congestion, sore throat, dry mouth, or aerophagia.  He has trouble keeping up with his diet.  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  He tries to avoid eating after 5 pm.  He has cut down on sodas and sweet tea.   Physical Exam:   Appearance - well kempt  ENMT - clear nasal mucosa, midline nasal septum, no oral exudates, no LAN, trachea midline, MP 3 Respiratory - normal chest wall, normal respiratory effort, no accessory muscle use, no wheeze/rales CV - s1s2 regular rate and rhythm, no murmurs, no peripheral edema, radial pulses symmetric GI - soft, non tender, no masses Lymph - no adenopathy noted in neck and axillary areas MSK - normal muscle strength and tone, normal gait Ext - no cyanosis, clubbing, or joint inflammation noted Skin - no rashes, lesions, or ulcers Neuro - normal strength Psych - normal mood and affect  Assessment/Plan:   Obstructive sleep apnea. - he is compliant with therapy and reports benefit from CPAP - continue auto CPAP  Obesity. - discussed options to assist with weight loss   Patient Instructions  Follow up in 1 year    Chesley Mires, MD [place] Pager: [phone] 03/10/2018, 2:11 PM  Flow Sheet    Sleep tests:  HST 07/03/16 >> AHI 35.6, SpO2 low  85% Auto CPAP 02/08/18 to 03/09/18 >> used on 30 of 30 nights with average 8 hrs 2 min.  Average AHI 2.9 with median CPAP 10 and 95 th percentile CPAP 14 cm H2O.  Medications:   Allergies as of 03/10/2018   No Known Allergies     Medication List        Accurate as of 03/10/18  2:11 PM. Always use your most recent med list.          aspirin 325 MG tablet Take 325 mg by mouth as needed.   fish oil-omega-3 fatty acids 1000 MG capsule Take 2 g by mouth daily.   ibuprofen 200 MG tablet Commonly known as:  ADVIL,MOTRIN Take 200 mg by mouth every 6 (six) hours as needed.   multivitamin tablet Take 1 tablet by mouth daily.   omeprazole 40 MG capsule Commonly known as:  PRILOSEC TAKE 1 CAPSULE DAILY   TYLENOL 8 HOUR PO Take by mouth.       Past Surgical History:  He  has a past surgical history that includes Knee arthroscopy w/ meniscal repair (Right, 04/02/2016) and Tonsillectomy.  Family History:  His family history includes Arthritis in his father; COPD in his mother; Diabetes in his mother; Heart disease in his father and mother; Hypertension in his father.  Social History:  He  reports that he  has never smoked. He has never used smokeless tobacco. He reports that he does not drink alcohol or use drugs.

## 2018-05-04 DIAGNOSIS — G4733 Obstructive sleep apnea (adult) (pediatric): Secondary | ICD-10-CM | POA: Diagnosis not present

## 2018-06-22 DIAGNOSIS — D1801 Hemangioma of skin and subcutaneous tissue: Secondary | ICD-10-CM | POA: Diagnosis not present

## 2018-06-22 DIAGNOSIS — D224 Melanocytic nevi of scalp and neck: Secondary | ICD-10-CM | POA: Diagnosis not present

## 2018-06-22 DIAGNOSIS — L570 Actinic keratosis: Secondary | ICD-10-CM | POA: Diagnosis not present

## 2018-06-22 DIAGNOSIS — Z85828 Personal history of other malignant neoplasm of skin: Secondary | ICD-10-CM | POA: Diagnosis not present

## 2018-06-22 DIAGNOSIS — L812 Freckles: Secondary | ICD-10-CM | POA: Diagnosis not present

## 2018-06-22 DIAGNOSIS — L821 Other seborrheic keratosis: Secondary | ICD-10-CM | POA: Diagnosis not present

## 2018-06-22 DIAGNOSIS — L820 Inflamed seborrheic keratosis: Secondary | ICD-10-CM | POA: Diagnosis not present

## 2018-06-22 DIAGNOSIS — L309 Dermatitis, unspecified: Secondary | ICD-10-CM | POA: Diagnosis not present

## 2018-07-13 DIAGNOSIS — M25561 Pain in right knee: Secondary | ICD-10-CM | POA: Diagnosis not present

## 2018-07-20 ENCOUNTER — Telehealth: Payer: Self-pay | Admitting: Pulmonary Disease

## 2018-07-20 DIAGNOSIS — Z6841 Body Mass Index (BMI) 40.0 and over, adult: Principal | ICD-10-CM

## 2018-07-20 NOTE — Telephone Encounter (Signed)
Called and spoke with Patient.  Patient stated that at his last OV with Dr Halford Chessman, they discussed a nutritionist referral.  Patient stated that he declined then, but he has changed his mind. Patient stated that he is ready to see a nutritionist to help in his weight loss.  Message routed to Dr Halford Chessman

## 2018-07-21 NOTE — Telephone Encounter (Signed)
Okay to put in referral to nutritionist.  Can use diagnosis codes for obstructive sleep apnea and morbid obesity.

## 2018-07-22 NOTE — Telephone Encounter (Signed)
Ok referral placed and pt was made aware.

## 2018-08-05 ENCOUNTER — Ambulatory Visit: Payer: PPO | Admitting: Registered"

## 2018-09-11 DIAGNOSIS — G4733 Obstructive sleep apnea (adult) (pediatric): Secondary | ICD-10-CM | POA: Diagnosis not present

## 2018-12-13 DIAGNOSIS — G4733 Obstructive sleep apnea (adult) (pediatric): Secondary | ICD-10-CM | POA: Diagnosis not present

## 2018-12-17 ENCOUNTER — Other Ambulatory Visit: Payer: Self-pay

## 2019-01-10 DIAGNOSIS — L812 Freckles: Secondary | ICD-10-CM | POA: Diagnosis not present

## 2019-01-10 DIAGNOSIS — D1801 Hemangioma of skin and subcutaneous tissue: Secondary | ICD-10-CM | POA: Diagnosis not present

## 2019-01-10 DIAGNOSIS — D485 Neoplasm of uncertain behavior of skin: Secondary | ICD-10-CM | POA: Diagnosis not present

## 2019-01-10 DIAGNOSIS — C44519 Basal cell carcinoma of skin of other part of trunk: Secondary | ICD-10-CM | POA: Diagnosis not present

## 2019-01-10 DIAGNOSIS — Z85828 Personal history of other malignant neoplasm of skin: Secondary | ICD-10-CM | POA: Diagnosis not present

## 2019-02-08 ENCOUNTER — Encounter: Payer: Self-pay | Admitting: Family Medicine

## 2019-02-08 ENCOUNTER — Ambulatory Visit (INDEPENDENT_AMBULATORY_CARE_PROVIDER_SITE_OTHER): Payer: PPO | Admitting: Family Medicine

## 2019-02-08 ENCOUNTER — Other Ambulatory Visit: Payer: Self-pay

## 2019-02-08 VITALS — BP 122/78 | HR 59 | Temp 97.6°F | Ht 69.0 in | Wt 268.8 lb

## 2019-02-08 DIAGNOSIS — Z23 Encounter for immunization: Secondary | ICD-10-CM | POA: Diagnosis not present

## 2019-02-08 DIAGNOSIS — Z0000 Encounter for general adult medical examination without abnormal findings: Secondary | ICD-10-CM | POA: Diagnosis not present

## 2019-02-08 LAB — HEPATIC FUNCTION PANEL
ALT: 14 U/L (ref 0–53)
AST: 15 U/L (ref 0–37)
Albumin: 4.3 g/dL (ref 3.5–5.2)
Alkaline Phosphatase: 51 U/L (ref 39–117)
Bilirubin, Direct: 0.1 mg/dL (ref 0.0–0.3)
Total Bilirubin: 0.5 mg/dL (ref 0.2–1.2)
Total Protein: 6.5 g/dL (ref 6.0–8.3)

## 2019-02-08 LAB — CBC WITH DIFFERENTIAL/PLATELET
Basophils Absolute: 0 10*3/uL (ref 0.0–0.1)
Basophils Relative: 0.8 % (ref 0.0–3.0)
Eosinophils Absolute: 0.3 10*3/uL (ref 0.0–0.7)
Eosinophils Relative: 4.4 % (ref 0.0–5.0)
HCT: 44.4 % (ref 39.0–52.0)
Hemoglobin: 14.8 g/dL (ref 13.0–17.0)
Lymphocytes Relative: 28.3 % (ref 12.0–46.0)
Lymphs Abs: 1.7 10*3/uL (ref 0.7–4.0)
MCHC: 33.2 g/dL (ref 30.0–36.0)
MCV: 90 fl (ref 78.0–100.0)
Monocytes Absolute: 0.6 10*3/uL (ref 0.1–1.0)
Monocytes Relative: 10.6 % (ref 3.0–12.0)
Neutro Abs: 3.3 10*3/uL (ref 1.4–7.7)
Neutrophils Relative %: 55.9 % (ref 43.0–77.0)
Platelets: 244 10*3/uL (ref 150.0–400.0)
RBC: 4.93 Mil/uL (ref 4.22–5.81)
RDW: 15.4 % (ref 11.5–15.5)
WBC: 5.9 10*3/uL (ref 4.0–10.5)

## 2019-02-08 LAB — BASIC METABOLIC PANEL
BUN: 12 mg/dL (ref 6–23)
CO2: 28 mEq/L (ref 19–32)
Calcium: 9.9 mg/dL (ref 8.4–10.5)
Chloride: 105 mEq/L (ref 96–112)
Creatinine, Ser: 1.07 mg/dL (ref 0.40–1.50)
GFR: 68.95 mL/min (ref >60.00)
Glucose, Bld: 92 mg/dL (ref 70–99)
Potassium: 4.6 mEq/L (ref 3.5–5.1)
Sodium: 140 mEq/L (ref 135–145)

## 2019-02-08 LAB — LIPID PANEL
Cholesterol: 132 mg/dL (ref 0–200)
HDL: 43.7 mg/dL (ref >39.00)
LDL Cholesterol: 73 mg/dL (ref 0–99)
NonHDL: 88.61
Total CHOL/HDL Ratio: 3
Triglycerides: 80 mg/dL (ref 0.0–149.0)
VLDL: 16 mg/dL (ref 0.0–40.0)

## 2019-02-08 LAB — PSA: PSA: 0.74 ng/mL (ref 0.10–4.00)

## 2019-02-08 LAB — TSH: TSH: 1.66 u[IU]/mL (ref 0.35–4.50)

## 2019-02-08 MED ORDER — OMEPRAZOLE 40 MG PO CPDR: 40.0000 mg | DELAYED_RELEASE_CAPSULE | Freq: Every day | ORAL | 3 refills | Status: DC

## 2019-02-08 NOTE — Progress Notes (Signed)
Subjective:    Patient ID: Christopher Blake, male    DOB: 1950-10-30, 68 y.o.   MRN: [id number]  HPI Here for a well exam. He feels well in general. We have not seen him in almost 3 years, but he has made such big dietary changes and he has lost 46 lbs in the past year. He has been seeing an Orthopedist named Dr. Bobette Mo in [place] for right knee pain, and they plan on doing a total replacement sometime soon.    Review of Systems  Constitutional: Negative.   HENT: Negative.   Eyes: Negative.   Respiratory: Negative.   Cardiovascular: Negative.   Gastrointestinal: Negative.   Genitourinary: Negative.   Musculoskeletal: Positive for arthralgias.  Skin: Negative.   Neurological: Negative.   Psychiatric/Behavioral: Negative.        Objective:   Physical Exam Constitutional:      General: He is not in acute distress.    Appearance: He is well-developed. He is not diaphoretic.  HENT:     Head: Normocephalic and atraumatic.     Right Ear: External ear normal.     Left Ear: External ear normal.     Nose: Nose normal.     Mouth/Throat:     Pharynx: No oropharyngeal exudate.  Eyes:     General: No scleral icterus.       Right eye: No discharge.        Left eye: No discharge.     Conjunctiva/sclera: Conjunctivae normal.     Pupils: Pupils are equal, round, and reactive to light.  Neck:     Musculoskeletal: Neck supple.     Thyroid: No thyromegaly.     Vascular: No JVD.     Trachea: No tracheal deviation.  Cardiovascular:     Rate and Rhythm: Normal rate and regular rhythm.     Heart sounds: Normal heart sounds. No murmur. No friction rub. No gallop.   Pulmonary:     Effort: Pulmonary effort is normal. No respiratory distress.     Breath sounds: Normal breath sounds. No wheezing or rales.  Chest:     Chest wall: No tenderness.  Abdominal:     General: Bowel sounds are normal. There is no distension.     Palpations: Abdomen is soft. There is no mass.   Tenderness: There is no abdominal tenderness. There is no guarding or rebound.  Genitourinary:    Penis: Normal. No tenderness.      Scrotum/Testes: Normal.     Prostate: Normal.     Rectum: Normal. Guaiac result negative.  Musculoskeletal: Normal range of motion.        General: No tenderness.  Lymphadenopathy:     Cervical: No cervical adenopathy.  Skin:    General: Skin is warm and dry.     Coloration: Skin is not pale.     Findings: No erythema or rash.  Neurological:     Mental Status: He is alert and oriented to person, place, and time.     Cranial Nerves: No cranial nerve deficit.     Motor: No abnormal muscle tone.     Coordination: Coordination normal.     Deep Tendon Reflexes: Reflexes are normal and symmetric. Reflexes normal.  Psychiatric:        Behavior: Behavior normal.        Thought Content: Thought content normal.        Judgment: Judgment normal.           Assessment &  Plan:  Well exam. We discussed diet and exercise. Get fasting labs.  Alysia Penna, MD

## 2019-02-11 ENCOUNTER — Encounter: Payer: PPO | Admitting: Family Medicine

## 2019-04-13 ENCOUNTER — Other Ambulatory Visit: Payer: Self-pay

## 2019-06-13 DIAGNOSIS — G4733 Obstructive sleep apnea (adult) (pediatric): Secondary | ICD-10-CM | POA: Diagnosis not present

## 2019-06-16 ENCOUNTER — Ambulatory Visit: Payer: PPO

## 2019-06-25 ENCOUNTER — Ambulatory Visit: Payer: PPO

## 2019-07-26 ENCOUNTER — Encounter: Payer: Self-pay | Admitting: Family Medicine

## 2019-08-08 DIAGNOSIS — I8312 Varicose veins of left lower extremity with inflammation: Secondary | ICD-10-CM | POA: Diagnosis not present

## 2019-08-08 DIAGNOSIS — L308 Other specified dermatitis: Secondary | ICD-10-CM | POA: Diagnosis not present

## 2019-08-08 DIAGNOSIS — I872 Venous insufficiency (chronic) (peripheral): Secondary | ICD-10-CM | POA: Diagnosis not present

## 2019-08-08 DIAGNOSIS — Z85828 Personal history of other malignant neoplasm of skin: Secondary | ICD-10-CM | POA: Diagnosis not present

## 2019-08-08 DIAGNOSIS — I8311 Varicose veins of right lower extremity with inflammation: Secondary | ICD-10-CM | POA: Diagnosis not present

## 2019-08-08 DIAGNOSIS — D485 Neoplasm of uncertain behavior of skin: Secondary | ICD-10-CM | POA: Diagnosis not present

## 2019-08-08 DIAGNOSIS — L812 Freckles: Secondary | ICD-10-CM | POA: Diagnosis not present

## 2019-08-08 DIAGNOSIS — D1801 Hemangioma of skin and subcutaneous tissue: Secondary | ICD-10-CM | POA: Diagnosis not present

## 2019-09-13 DIAGNOSIS — G4733 Obstructive sleep apnea (adult) (pediatric): Secondary | ICD-10-CM | POA: Diagnosis not present

## 2019-10-13 ENCOUNTER — Other Ambulatory Visit: Payer: Self-pay

## 2019-10-13 ENCOUNTER — Ambulatory Visit (AMBULATORY_SURGERY_CENTER): Payer: Self-pay | Admitting: *Deleted

## 2019-10-13 VITALS — Ht 69.0 in | Wt 274.0 lb

## 2019-10-13 DIAGNOSIS — Z86010 Personal history of colonic polyps: Secondary | ICD-10-CM

## 2019-10-13 MED ORDER — SUTAB 1479-225-188 MG PO TABS: 1.0000 | ORAL_TABLET | ORAL | 0 refills | Status: DC

## 2019-10-13 NOTE — Progress Notes (Signed)
Patient is here in-person for PV. Patient denies any allergies to eggs or soy. Patient denies any problems with anesthesia/sedation. Patient denies any oxygen use at home. Patient denies taking any diet/weight loss medications or blood thinners. Patient is not being treated for MRSA or C-diff. Patient is aware of our care-partner policy and 0000000 safety protocol. EMMI education assisgned to the patient for the procedure, this was explained and instructions given to patient.   Completed covid vaccines x2 per pt. 07/14/19. Prep Prescription coupon given to the patient.

## 2019-10-25 ENCOUNTER — Encounter: Payer: Self-pay | Admitting: Gastroenterology

## 2019-10-25 ENCOUNTER — Ambulatory Visit (AMBULATORY_SURGERY_CENTER): Payer: PPO | Admitting: Gastroenterology

## 2019-10-25 ENCOUNTER — Other Ambulatory Visit: Payer: Self-pay

## 2019-10-25 VITALS — BP 116/71 | HR 67 | Temp 97.1°F | Resp 14 | Ht 69.0 in | Wt 274.0 lb

## 2019-10-25 DIAGNOSIS — D124 Benign neoplasm of descending colon: Secondary | ICD-10-CM | POA: Diagnosis not present

## 2019-10-25 DIAGNOSIS — Z1211 Encounter for screening for malignant neoplasm of colon: Secondary | ICD-10-CM | POA: Diagnosis not present

## 2019-10-25 DIAGNOSIS — D123 Benign neoplasm of transverse colon: Secondary | ICD-10-CM | POA: Diagnosis not present

## 2019-10-25 DIAGNOSIS — Z86010 Personal history of colonic polyps: Secondary | ICD-10-CM | POA: Diagnosis not present

## 2019-10-25 DIAGNOSIS — D122 Benign neoplasm of ascending colon: Secondary | ICD-10-CM

## 2019-10-25 DIAGNOSIS — K635 Polyp of colon: Secondary | ICD-10-CM | POA: Diagnosis not present

## 2019-10-25 HISTORY — PX: COLONOSCOPY: SHX174

## 2019-10-25 MED ORDER — SODIUM CHLORIDE 0.9 % IV SOLN: 500.0000 mL | Freq: Once | INTRAVENOUS | Status: DC

## 2019-10-25 NOTE — Progress Notes (Signed)
Pt's states no medical or surgical changes since previsit or office visit. 

## 2019-10-25 NOTE — Op Note (Signed)
[facility] Patient Name: Christopher Blake Procedure Date: 10/25/2019 12:55 PM MRN: [id number] Endoscopist: [name] , MD Age: 69 Referring MD:  Date of Birth: [birth year] Gender: Male Account #: [id number] Procedure:                Colonoscopy Indications:              High risk colon cancer surveillance: Personal                            history of colonic polyps Medicines:                Monitored Anesthesia Care Procedure:                Pre-Anesthesia Assessment:                           - Prior to the procedure, a History and Physical                            was performed, and patient medications and                            allergies were reviewed. The patient's tolerance of                            previous anesthesia was also reviewed. The risks                            and benefits of the procedure and the sedation                            options and risks were discussed with the patient.                            All questions were answered, and informed consent                            was obtained. Prior Anticoagulants: The patient has                            taken no previous anticoagulant or antiplatelet                            agents. ASA Grade Assessment: III - A patient with                            severe systemic disease. After reviewing the risks                            and benefits, the patient was deemed in                            satisfactory condition to undergo the procedure.  After obtaining informed consent, the colonoscope                            was passed under direct vision. Throughout the                            procedure, the patient's blood pressure, pulse, and                            oxygen saturations were monitored continuously. The                            Colonoscope was introduced through the anus and                            advanced to the the cecum,  identified by                            appendiceal orifice and ileocecal valve. The                            colonoscopy was performed without difficulty. The                            patient tolerated the procedure well. The quality                            of the bowel preparation was adequate. The                            ileocecal valve, appendiceal orifice, and rectum                            were photographed. Scope In: 1:04:37 PM Scope Out: 1:28:15 PM Scope Withdrawal Time: 0 hours 21 minutes 39 seconds  Total Procedure Duration: 0 hours 23 minutes 38 seconds  Findings:                 The perianal and digital rectal examinations were                            normal.                           Three sessile polyps were found in the ascending                            colon. The polyps were 3 to 6 mm in size. These                            polyps were removed with a cold snare. Resection                            and retrieval were complete.  A 3 mm polyp was found in the splenic flexure. The                            polyp was sessile. The polyp was removed with a                            cold snare. Resection and retrieval were complete.                           A 3 mm polyp was found in the descending colon. The                            polyp was sessile. The polyp was removed with a                            cold snare. Resection and retrieval were complete.                           Multiple medium-mouthed diverticula were found in                            the entire colon.                           Internal hemorrhoids were found during retroflexion.                           The exam was otherwise without abnormality. Complications:            No immediate complications. Estimated blood loss:                            Minimal. Estimated Blood Loss:     Estimated blood loss was minimal. Impression:               - Three 3  to 6 mm polyps in the ascending colon,                            removed with a cold snare. Resected and retrieved.                           - One 3 mm polyp at the splenic flexure, removed                            with a cold snare. Resected and retrieved.                           - One 3 mm polyp in the descending colon, removed                            with a cold snare. Resected and retrieved.                           -  Diverticulosis in the entire examined colon.                           - Internal hemorrhoids.                           - The examination was otherwise normal. Recommendation:           - Patient has a contact number available for                            emergencies. The signs and symptoms of potential                            delayed complications were discussed with the                            patient. Return to normal activities tomorrow.                            Written discharge instructions were provided to the                            patient.                           - Resume previous diet.                           - Continue present medications.                           - Await pathology results. Remo Lipps P. [name], MD 10/25/2019 1:38:22 PM This report has been signed electronically.

## 2019-10-25 NOTE — Patient Instructions (Signed)
Handout on polyps and diverticulosis given    YOU HAD AN ENDOSCOPIC PROCEDURE TODAY AT THE [facility]:   Refer to the procedure report that was given to you for any specific questions about what was found during the examination.  If the procedure report does not answer your questions, please call your gastroenterologist to clarify.  If you requested that your care partner not be given the details of your procedure findings, then the procedure report has been included in a sealed envelope for you to review at your convenience later.  YOU SHOULD EXPECT: Some feelings of bloating in the abdomen. Passage of more gas than usual.  Walking can help get rid of the air that was put into your GI tract during the procedure and reduce the bloating. If you had a lower endoscopy (such as a colonoscopy or flexible sigmoidoscopy) you may notice spotting of blood in your stool or on the toilet paper. If you underwent a bowel prep for your procedure, you may not have a normal bowel movement for a few days.  Please Note:  You might notice some irritation and congestion in your nose or some drainage.  This is from the oxygen used during your procedure.  There is no need for concern and it should clear up in a day or so.  SYMPTOMS TO REPORT IMMEDIATELY:   Following lower endoscopy (colonoscopy or flexible sigmoidoscopy):  Excessive amounts of blood in the stool  Significant tenderness or worsening of abdominal pains  Swelling of the abdomen that is new, acute  Fever of 100F or higher   For urgent or emergent issues, a gastroenterologist can be reached at any hour by calling [phone]. Do not use MyChart messaging for urgent concerns.    DIET:  We do recommend a small meal at first, but then you may proceed to your regular diet.  Drink plenty of fluids but you should avoid alcoholic beverages for 24 hours.  ACTIVITY:  You should plan to take it easy for the rest of today and you should NOT  DRIVE or use heavy machinery until tomorrow (because of the sedation medicines used during the test).    FOLLOW UP: Our staff will call the number listed on your records 48-72 hours following your procedure to check on you and address any questions or concerns that you may have regarding the information given to you following your procedure. If we do not reach you, we will leave a message.  We will attempt to reach you two times.  During this call, we will ask if you have developed any symptoms of COVID 19. If you develop any symptoms (ie: fever, flu-like symptoms, shortness of breath, cough etc.) before then, please call [phone].  If you test positive for Covid 19 in the 2 weeks post procedure, please call and report this information to us.    If any biopsies were taken you will be contacted by phone or by letter within the next 1-3 weeks.  Please call us at [phone] if you have not heard about the biopsies in 3 weeks.    SIGNATURES/CONFIDENTIALITY: You and/or your care partner have signed paperwork which will be entered into your electronic medical record.  These signatures attest to the fact that that the information above on your After Visit Summary has been reviewed and is understood.  Full responsibility of the confidentiality of this discharge information lies with you and/or your care-partner. 

## 2019-10-25 NOTE — Progress Notes (Signed)
Called to room to assist during endoscopic procedure.  Patient ID and intended procedure confirmed with present staff. Received instructions for my participation in the procedure from the performing physician.  

## 2019-10-25 NOTE — Progress Notes (Signed)
Pt Drowsy. VSS. To PACU, report to RN. No anesthetic complications noted.  

## 2019-10-27 ENCOUNTER — Telehealth: Payer: Self-pay | Admitting: *Deleted

## 2019-10-27 NOTE — Telephone Encounter (Signed)
1. Have you developed a fever since your procedure? no  2.   Have you had an respiratory symptoms (SOB or cough) since your procedure? no  3.   Have you tested positive for COVID 19 since your procedure no  4.   Have you had any family members/close contacts diagnosed with the COVID 19 since your procedure?  no   If yes to any of these questions please route to Joylene John, RN and Erenest Rasher, RN Follow up Call-  Call back number 10/25/2019  Post procedure Call Back phone  # [phone]  Permission to leave phone message Yes  Some recent data might be hidden     Patient questions:  Do you have a fever, pain , or abdominal swelling? No. Pain Score  0 *  Have you tolerated food without any problems? Yes.    Have you been able to return to your normal activities? Yes.    Do you have any questions about your discharge instructions: Diet   No. Medications  No. Follow up visit  No.  Do you have questions or concerns about your Care? No.  Actions: * If pain score is 4 or above: No action needed, pain <4.

## 2019-12-16 DIAGNOSIS — G4733 Obstructive sleep apnea (adult) (pediatric): Secondary | ICD-10-CM | POA: Diagnosis not present

## 2020-02-01 DIAGNOSIS — H524 Presbyopia: Secondary | ICD-10-CM | POA: Diagnosis not present

## 2020-02-09 DIAGNOSIS — L570 Actinic keratosis: Secondary | ICD-10-CM | POA: Diagnosis not present

## 2020-02-09 DIAGNOSIS — D1801 Hemangioma of skin and subcutaneous tissue: Secondary | ICD-10-CM | POA: Diagnosis not present

## 2020-02-09 DIAGNOSIS — I872 Venous insufficiency (chronic) (peripheral): Secondary | ICD-10-CM | POA: Diagnosis not present

## 2020-02-09 DIAGNOSIS — Z85828 Personal history of other malignant neoplasm of skin: Secondary | ICD-10-CM | POA: Diagnosis not present

## 2020-02-09 DIAGNOSIS — D225 Melanocytic nevi of trunk: Secondary | ICD-10-CM | POA: Diagnosis not present

## 2020-02-09 DIAGNOSIS — I8312 Varicose veins of left lower extremity with inflammation: Secondary | ICD-10-CM | POA: Diagnosis not present

## 2020-02-09 DIAGNOSIS — L308 Other specified dermatitis: Secondary | ICD-10-CM | POA: Diagnosis not present

## 2020-02-09 DIAGNOSIS — L812 Freckles: Secondary | ICD-10-CM | POA: Diagnosis not present

## 2020-02-09 DIAGNOSIS — I8311 Varicose veins of right lower extremity with inflammation: Secondary | ICD-10-CM | POA: Diagnosis not present

## 2020-02-13 ENCOUNTER — Other Ambulatory Visit: Payer: Self-pay

## 2020-02-13 ENCOUNTER — Encounter: Payer: Self-pay | Admitting: Family Medicine

## 2020-02-13 ENCOUNTER — Ambulatory Visit (INDEPENDENT_AMBULATORY_CARE_PROVIDER_SITE_OTHER): Payer: PPO | Admitting: Family Medicine

## 2020-02-13 VITALS — BP 140/90 | HR 61 | Temp 98.9°F | Ht 69.0 in | Wt 274.6 lb

## 2020-02-13 DIAGNOSIS — Z0000 Encounter for general adult medical examination without abnormal findings: Secondary | ICD-10-CM

## 2020-02-13 DIAGNOSIS — Z23 Encounter for immunization: Secondary | ICD-10-CM

## 2020-02-13 NOTE — Addendum Note (Signed)
Addended by: Matilde Sprang on: 02/13/2020 09:59 AM   Modules accepted: Orders

## 2020-02-13 NOTE — Progress Notes (Signed)
   Subjective:    Patient ID: Christopher Blake, male    DOB: Jul 17, 1950, 69 y.o.   MRN: [id number]  HPI Here for a well exam. He feels fine. He has lost about 45 lbs since last year.    Review of Systems  Constitutional: Negative.   HENT: Negative.   Eyes: Negative.   Respiratory: Negative.   Cardiovascular: Negative.   Gastrointestinal: Negative.   Genitourinary: Negative.   Musculoskeletal: Negative.   Skin: Negative.   Neurological: Negative.   Psychiatric/Behavioral: Negative.        Objective:   Physical Exam Constitutional:      General: He is not in acute distress.    Appearance: He is well-developed. He is not diaphoretic.  HENT:     Head: Normocephalic and atraumatic.     Right Ear: External ear normal.     Left Ear: External ear normal.     Nose: Nose normal.     Mouth/Throat:     Pharynx: No oropharyngeal exudate.  Eyes:     General: No scleral icterus.       Right eye: No discharge.        Left eye: No discharge.     Conjunctiva/sclera: Conjunctivae normal.     Pupils: Pupils are equal, round, and reactive to light.  Neck:     Thyroid: No thyromegaly.     Vascular: No JVD.     Trachea: No tracheal deviation.  Cardiovascular:     Rate and Rhythm: Normal rate and regular rhythm.     Heart sounds: Normal heart sounds. No murmur heard.  No friction rub. No gallop.   Pulmonary:     Effort: Pulmonary effort is normal. No respiratory distress.     Breath sounds: Normal breath sounds. No wheezing or rales.  Chest:     Chest wall: No tenderness.  Abdominal:     General: Bowel sounds are normal. There is no distension.     Palpations: Abdomen is soft. There is no mass.     Tenderness: There is no abdominal tenderness. There is no guarding or rebound.  Genitourinary:    Penis: Normal. No tenderness.      Testes: Normal.     Prostate: Normal.     Rectum: Normal. Guaiac result negative.  Musculoskeletal:        General: No tenderness. Normal range of  motion.     Cervical back: Neck supple.  Lymphadenopathy:     Cervical: No cervical adenopathy.  Skin:    General: Skin is warm and dry.     Coloration: Skin is not pale.     Findings: No erythema or rash.  Neurological:     Mental Status: He is alert and oriented to person, place, and time.     Cranial Nerves: No cranial nerve deficit.     Motor: No abnormal muscle tone.     Coordination: Coordination normal.     Deep Tendon Reflexes: Reflexes are normal and symmetric. Reflexes normal.  Psychiatric:        Behavior: Behavior normal.        Thought Content: Thought content normal.        Judgment: Judgment normal.           Assessment & Plan:  Well exam. We discussed diet and exercise. Get fasting labs.  Alysia Penna, MD

## 2020-02-14 LAB — CBC WITH DIFFERENTIAL/PLATELET
Absolute Monocytes: 674 cells/uL (ref 200–950)
Basophils Absolute: 63 cells/uL (ref 0–200)
Basophils Relative: 1 %
Eosinophils Absolute: 460 cells/uL (ref 15–500)
Eosinophils Relative: 7.3 %
HCT: 47.2 % (ref 38.5–50.0)
Hemoglobin: 15.5 g/dL (ref 13.2–17.1)
Lymphs Abs: 2041 cells/uL (ref 850–3900)
MCH: 29.6 pg (ref 27.0–33.0)
MCHC: 32.8 g/dL (ref 32.0–36.0)
MCV: 90.1 fL (ref 80.0–100.0)
MPV: 10.5 fL (ref 7.5–12.5)
Monocytes Relative: 10.7 %
Neutro Abs: 3062 cells/uL (ref 1500–7800)
Neutrophils Relative %: 48.6 %
Platelets: 290 10*3/uL (ref 140–400)
RBC: 5.24 10*6/uL (ref 4.20–5.80)
RDW: 13.1 % (ref 11.0–15.0)
Total Lymphocyte: 32.4 %
WBC: 6.3 10*3/uL (ref 3.8–10.8)

## 2020-02-14 LAB — BASIC METABOLIC PANEL
BUN: 15 mg/dL (ref 7–25)
CO2: 22 mmol/L (ref 20–32)
Calcium: 10 mg/dL (ref 8.6–10.3)
Chloride: 106 mmol/L (ref 98–110)
Creat: 1.14 mg/dL (ref 0.70–1.25)
Glucose, Bld: 105 mg/dL — ABNORMAL HIGH (ref 65–99)
Potassium: 4.2 mmol/L (ref 3.5–5.3)
Sodium: 139 mmol/L (ref 135–146)

## 2020-02-14 LAB — LIPID PANEL
Cholesterol: 158 mg/dL (ref <200)
HDL: 50 mg/dL (ref >=40)
LDL Cholesterol (Calc): 89 mg/dL (calc)
Non-HDL Cholesterol (Calc): 108 mg/dL (calc) (ref <130)
Total CHOL/HDL Ratio: 3.2 (calc) (ref <5.0)
Triglycerides: 92 mg/dL (ref <150)

## 2020-02-14 LAB — HEPATIC FUNCTION PANEL
AG Ratio: 1.9 (calc) (ref 1.0–2.5)
ALT: 14 U/L (ref 9–46)
AST: 19 U/L (ref 10–35)
Albumin: 4.5 g/dL (ref 3.6–5.1)
Alkaline phosphatase (APISO): 49 U/L (ref 35–144)
Bilirubin, Direct: 0.2 mg/dL (ref 0.0–0.2)
Globulin: 2.4 g/dL (calc) (ref 1.9–3.7)
Indirect Bilirubin: 0.5 mg/dL (calc) (ref 0.2–1.2)
Total Bilirubin: 0.7 mg/dL (ref 0.2–1.2)
Total Protein: 6.9 g/dL (ref 6.1–8.1)

## 2020-02-14 LAB — PSA: PSA: 0.61 ng/mL (ref <=4.0)

## 2020-02-14 LAB — TSH: TSH: 1.68 mIU/L (ref 0.40–4.50)

## 2020-03-22 DIAGNOSIS — G4733 Obstructive sleep apnea (adult) (pediatric): Secondary | ICD-10-CM | POA: Diagnosis not present

## 2020-05-03 ENCOUNTER — Telehealth: Payer: Self-pay | Admitting: Family Medicine

## 2020-05-03 NOTE — Telephone Encounter (Signed)
Left message for patient to call back and schedule Medicare Annual Wellness Visit (AWVI) either virtually or in office.   Last AWV no information  please schedule at anytime with [facility] Nurse Health Advisor 1 or 2   This should be a 45 minute visit.

## 2020-06-26 DIAGNOSIS — G4733 Obstructive sleep apnea (adult) (pediatric): Secondary | ICD-10-CM | POA: Diagnosis not present

## 2020-07-27 NOTE — Progress Notes (Signed)
Subjective:   Christopher Blake is a 70 y.o. male who presents for an Initial Medicare Annual Wellness Visit.  Virtual Visit via Video Note  I connected with Christopher Blake by a video enabled telemedicine application and verified that I am speaking with the correct person using two identifiers.  Location: Patient: Home Provider: Office Persons participating in the virtual visit: patient, provider   I discussed the limitations of evaluation and management by telemedicine and the availability of in person appointments. The patient expressed understanding and agreed to proceed.     Young Harris    Review of Systems    N/A  Cardiac Risk Factors include: advanced age (>61men, >71 women)     Objective:    Today's Vitals   There is no height or weight on file to calculate BMI.  Advanced Directives 07/30/2020 09/01/2016 06/23/2016  Does Patient Have a Medical Advance Directive? Yes No No  Type of Paramedic of [place];Living will - -  Does patient want to make changes to medical advance directive? No - Patient declined - -  Copy of [place] in Chart? No - copy requested - -    Current Medications (verified) Outpatient Encounter Medications as of 07/30/2020  Medication Sig  . Acetaminophen (TYLENOL 8 HOUR PO) Take by mouth.  . APPLE CIDER VINEGAR PO Take by mouth.  Marland Kitchen aspirin 325 MG tablet Take 325 mg by mouth as needed.  . Cyanocobalamin (VITAMIN B 12 PO) Take by mouth.  Marland Kitchen ibuprofen (ADVIL,MOTRIN) 200 MG tablet Take 200 mg by mouth every 6 (six) hours as needed.  . Multiple Vitamin (MULTIVITAMIN) tablet Take 1 tablet by mouth daily.  . NON FORMULARY CBD oil po daily  . VITAMIN D PO Take by mouth.  . zinc gluconate 50 MG tablet Take 50 mg by mouth daily.   No facility-administered encounter medications on file as of 07/30/2020.    Allergies (verified) Patient has no known allergies.   History: Past Medical History:   Diagnosis Date  . Arthritis    right knee  . Cancer ([place])    skin cancer basel cell  . GERD (gastroesophageal reflux disease)   . Sleep apnea    CPAP use   Past Surgical History:  Procedure Laterality Date  . arm fracture surgery    . COLONOSCOPY  10/25/2019   per Dr. Havery Moros, adenomatous polyps, repeat in 3 yrs   . KNEE ARTHROSCOPY W/ MENISCAL REPAIR Right 04/02/2016  . TONSILLECTOMY     Family History  Problem Relation Age of Onset  . Arthritis Father   . Heart disease Father   . Hypertension Father   . Heart disease Mother   . Diabetes Mother   . COPD Mother   . Colon cancer Neg Hx   . Esophageal cancer Neg Hx   . Rectal cancer Neg Hx   . Stomach cancer Neg Hx   . Colon polyps Neg Hx    Social History   Socioeconomic History  . Marital status: Married    Spouse name: Not on file  . Number of children: Not on file  . Years of education: Not on file  . Highest education level: Not on file  Occupational History  . Not on file  Tobacco Use  . Smoking status: Never Smoker  . Smokeless tobacco: Never Used  Vaping Use  . Vaping Use: Never used  Substance and Sexual Activity  . Alcohol use: No  . Drug use: No  .  Sexual activity: Not on file  Other Topics Concern  . Not on file  Social History Narrative  . Not on file   Social Determinants of Health   Financial Resource Strain: Low Risk   . Difficulty of Paying Living Expenses: Not hard at all  Food Insecurity: No Food Insecurity  . Worried About Charity fundraiser in the Last Year: Never true  . Ran Out of Food in the Last Year: Never true  Transportation Needs: No Transportation Needs  . Lack of Transportation (Medical): No  . Lack of Transportation (Non-Medical): No  Physical Activity: Insufficiently Active  . Days of Exercise per Week: 2 days  . Minutes of Exercise per Session: 30 min  Stress: No Stress Concern Present  . Feeling of Stress : Not at all  Social Connections: Moderately  Integrated  . Frequency of Communication with Friends and Family: More than three times a week  . Frequency of Social Gatherings with Friends and Family: More than three times a week  . Attends Religious Services: More than 4 times per year  . Active Member of Clubs or Organizations: No  . Attends Archivist Meetings: Never  . Marital Status: Married    Tobacco Counseling Counseling given: Not Answered   Clinical Intake:  Pre-visit preparation completed: Yes  Pain : No/denies pain     Nutritional Risks: None Diabetes: No  How often do you need to have someone help you when you read instructions, pamphlets, or other written materials from your doctor or pharmacy?: 1 - Never  Diabetic?No   Interpreter Needed?: No  Information entered by :: [place] of Daily Living In your present state of health, do you have any difficulty performing the following activities: 07/30/2020  Hearing? N  Vision? N  Difficulty concentrating or making decisions? N  Walking or climbing stairs? N  Dressing or bathing? N  Doing errands, shopping? N  Preparing Food and eating ? N  Using the Toilet? N  In the past six months, have you accidently leaked urine? N  Do you have problems with loss of bowel control? N  Managing your Medications? N  Managing your Finances? N  Housekeeping or managing your Housekeeping? N  Some recent data might be hidden    Patient Care Team: Laurey Morale, MD as PCP - General (Family Medicine)  Indicate any recent Medical Services you may have received from other than [facility] providers in the past year (date may be approximate).     Assessment:   This is a routine wellness examination for Christopher Blake.  Hearing/Vision screen  Hearing Screening   125Hz  250Hz  500Hz  1000Hz  2000Hz  3000Hz  4000Hz  6000Hz  8000Hz   Right ear:           Left ear:           Vision Screening Comments: Patient states he gets his eyes examined yearly   Dietary issues  and exercise activities discussed: Current Exercise Habits: Home exercise routine, Type of exercise: walking, Time (Minutes): 25, Frequency (Times/Week): 2, Weekly Exercise (Minutes/Week): 50, Intensity: Mild  Goals    . Weight (lb) < 250 lb (113.4 kg)      Depression Screen PHQ 2/9 Scores 07/30/2020  PHQ - 2 Score 0    Fall Risk Fall Risk  07/30/2020 04/13/2019 12/17/2018  Falls in the past year? 0 0 (No Data)  Comment - Emmi Telephone Survey: data to providers prior to load Emmi Telephone Survey: data to providers prior to  load  Number falls in past yr: 0 - (No Data)  Comment - - Emmi Telephone Survey Actual Response =   Injury with Fall? 0 - -  Risk for fall due to : No Fall Risks - -  Follow up Falls evaluation completed;Falls prevention discussed - -    FALL RISK PREVENTION PERTAINING TO THE HOME:  Any stairs in or around the home? No  If so, are there any without handrails? No  Home free of loose throw rugs in walkways, pet beds, electrical cords, etc? Yes  Adequate lighting in your home to reduce risk of falls? Yes   ASSISTIVE DEVICES UTILIZED TO PREVENT FALLS:  Life alert? No  Use of a cane, walker or w/c? No  Grab bars in the bathroom? Yes  Shower chair or bench in shower? Yes  Elevated toilet seat or a handicapped toilet? Yes    Cognitive Function:     Normal cognitive status assessed by direct observation by this Nurse Health Advisor. No abnormalities found.      Immunizations Immunization History  Administered Date(s) Administered  . Fluad Quad(high Dose 65+) 02/08/2019, 02/13/2020  . Influenza Split 03/02/2012, 01/26/2013  . Influenza, High Dose Seasonal PF 02/27/2018  . Influenza, Seasonal, Injecte, Preservative Fre 03/02/2012, 02/22/2016  . Influenza,inj,Quad PF,6+ Mos 01/26/2013  . Influenza,inj,quad, With Preservative 02/22/2017  . Influenza-Unspecified 02/23/2016  . PFIZER(Purple Top)SARS-COV-2 Vaccination 06/16/2019, 07/14/2019  . Pneumococcal  Conjugate-13 02/08/2019  . Pneumococcal Polysaccharide-23 02/13/2020  . Zoster 03/03/2013    TDAP status: Up to date  Flu Vaccine status: Up to date  Pneumococcal vaccine status: Up to date  Covid-19 vaccine status: Completed vaccines  Qualifies for Shingles Vaccine? Yes   Zostavax completed Yes   Shingrix Completed?: No.    Education has been provided regarding the importance of this vaccine. Patient has been advised to call insurance company to determine out of pocket expense if they have not yet received this vaccine. Advised may also receive vaccine at local pharmacy or Health Dept. Verbalized acceptance and understanding.  Screening Tests Health Maintenance  Topic Date Due  . Hepatitis C Screening  Never done  . TETANUS/TDAP  Never done  . COVID-19 Vaccine (3 - Booster for Pfizer series) 01/11/2020  . COLONOSCOPY (Pts 45-71yrs Insurance coverage will need to be confirmed)  10/25/2022  . INFLUENZA VACCINE  Completed  . PNA vac Low Risk Adult  Completed  . HPV VACCINES  Aged Out    Health Maintenance  Health Maintenance Due  Topic Date Due  . Hepatitis C Screening  Never done  . TETANUS/TDAP  Never done  . COVID-19 Vaccine (3 - Booster for Pfizer series) 01/11/2020    Colorectal cancer screening: Type of screening: Colonoscopy. Completed 10/25/2019. Repeat every 3 years  Lung Cancer Screening: (Low Dose CT Chest recommended if Age 78-80 years, 30 pack-year currently smoking OR have quit w/in 15years.) does not qualify.   Lung Cancer Screening Referral: N/A   Additional Screening:  Hepatitis C Screening: does qualify;   Vision Screening: Recommended annual ophthalmology exams for early detection of glaucoma and other disorders of the eye. Is the patient up to date with their annual eye exam?  Yes  Who is the provider or what is the name of the office in which the patient attends annual eye exams? [facility] If pt is not established with a provider, would they  like to be referred to a provider to establish care? No .   Dental Screening: Recommended  annual dental exams for proper oral hygiene  Community Resource Referral / Chronic Care Management: CRR required this visit?  No   CCM required this visit?  No      Plan:     I have personally reviewed and noted the following in the patient's chart:   . Medical and social history . Use of alcohol, tobacco or illicit drugs  . Current medications and supplements . Functional ability and status . Nutritional status . Physical activity . Advanced directives . List of other physicians . Hospitalizations, surgeries, and ER visits in previous 12 months . Vitals . Screenings to include cognitive, depression, and falls . Referrals and appointments  In addition, I have reviewed and discussed with patient certain preventive protocols, quality metrics, and best practice recommendations. A written personalized care plan for preventive services as well as general preventive health recommendations were provided to patient.     Ofilia Neas, LPN   1/55/2080   Nurse Notes: None

## 2020-07-30 ENCOUNTER — Ambulatory Visit (INDEPENDENT_AMBULATORY_CARE_PROVIDER_SITE_OTHER): Payer: Medicare Other

## 2020-07-30 DIAGNOSIS — Z0000 Encounter for general adult medical examination without abnormal findings: Secondary | ICD-10-CM

## 2020-07-30 NOTE — Patient Instructions (Signed)
Christopher Blake , Thank you for taking time to come for your Medicare Wellness Visit. I appreciate your ongoing commitment to your health goals. Please review the following plan we discussed and let me know if I can assist you in the future.   Screening recommendations/referrals: Colonoscopy: Up to date, next due 10/25/2022 Recommended yearly ophthalmology/optometry visit for glaucoma screening and checkup Recommended yearly dental visit for hygiene and checkup  Vaccinations: Influenza vaccine: Up to date, next due fall 2022  Pneumococcal vaccine: Completed series  Tdap vaccine: Currently due, you may await and injury to receive  Shingles vaccine: Currently due, We recommend that you get this at your local pharmacy as it is less expensive.     Advanced directives: Please bring in a copy of your advanced medical directives to our office so that we may scan them into your chart.   Conditions/risks identified: none   Next appointment: None  Preventive Care 39 Years and Older, Male Preventive care refers to lifestyle choices and visits with your health care provider that can promote health and wellness. What does preventive care include?  A yearly physical exam. This is also called an annual well check.  Dental exams once or twice a year.  Routine eye exams. Ask your health care provider how often you should have your eyes checked.  Personal lifestyle choices, including:  Daily care of your teeth and gums.  Regular physical activity.  Eating a healthy diet.  Avoiding tobacco and drug use.  Limiting alcohol use.  Practicing safe sex.  Taking low doses of aspirin every day.  Taking vitamin and mineral supplements as recommended by your health care provider. What happens during an annual well check? The services and screenings done by your health care provider during your annual well check will depend on your age, overall health, lifestyle risk factors, and family history of  disease. Counseling  Your health care provider may ask you questions about your:  Alcohol use.  Tobacco use.  Drug use.  Emotional well-being.  Home and relationship well-being.  Sexual activity.  Eating habits.  History of falls.  Memory and ability to understand (cognition).  Work and work Statistician. Screening  You may have the following tests or measurements:  Height, weight, and BMI.  Blood pressure.  Lipid and cholesterol levels. These may be checked every 5 years, or more frequently if you are over 38 years old.  Skin check.  Lung cancer screening. You may have this screening every year starting at age 68 if you have a 30-pack-year history of smoking and currently smoke or have quit within the past 15 years.  Fecal occult blood test (FOBT) of the stool. You may have this test every year starting at age 40.  Flexible sigmoidoscopy or colonoscopy. You may have a sigmoidoscopy every 5 years or a colonoscopy every 10 years starting at age 67.  Prostate cancer screening. Recommendations will vary depending on your family history and other risks.  Hepatitis C blood test.  Hepatitis B blood test.  Sexually transmitted disease (STD) testing.  Diabetes screening. This is done by checking your blood sugar (glucose) after you have not eaten for a while (fasting). You may have this done every 1-3 years.  Abdominal aortic aneurysm (AAA) screening. You may need this if you are a current or former smoker.  Osteoporosis. You may be screened starting at age 37 if you are at high risk. Talk with your health care provider about your test results, treatment options, and  if necessary, the need for more tests. Vaccines  Your health care provider may recommend certain vaccines, such as:  Influenza vaccine. This is recommended every year.  Tetanus, diphtheria, and acellular pertussis (Tdap, Td) vaccine. You may need a Td booster every 10 years.  Zoster vaccine. You may  need this after age 27.  Pneumococcal 13-valent conjugate (PCV13) vaccine. One dose is recommended after age 63.  Pneumococcal polysaccharide (PPSV23) vaccine. One dose is recommended after age 75. Talk to your health care provider about which screenings and vaccines you need and how often you need them. This information is not intended to replace advice given to you by your health care provider. Make sure you discuss any questions you have with your health care provider. Document Released: 06/01/2015 Document Revised: 01/23/2016 Document Reviewed: 03/06/2015 Elsevier Interactive Patient Education  2017 [place] Prevention in the Home Falls can cause injuries. They can happen to people of all ages. There are many things you can do to make your home safe and to help prevent falls. What can I do on the outside of my home?  Regularly fix the edges of walkways and driveways and fix any cracks.  Remove anything that might make you trip as you walk through a door, such as a raised step or threshold.  Trim any bushes or trees on the path to your home.  Use bright outdoor lighting.  Clear any walking paths of anything that might make someone trip, such as rocks or tools.  Regularly check to see if handrails are loose or broken. Make sure that both sides of any steps have handrails.  Any raised decks and porches should have guardrails on the edges.  Have any leaves, snow, or ice cleared regularly.  Use sand or salt on walking paths during winter.  Clean up any spills in your garage right away. This includes oil or grease spills. What can I do in the bathroom?  Use night lights.  Install grab bars by the toilet and in the tub and shower. Do not use towel bars as grab bars.  Use non-skid mats or decals in the tub or shower.  If you need to sit down in the shower, use a plastic, non-slip stool.  Keep the floor dry. Clean up any water that spills on the floor as soon as it  happens.  Remove soap buildup in the tub or shower regularly.  Attach bath mats securely with double-sided non-slip rug tape.  Do not have throw rugs and other things on the floor that can make you trip. What can I do in the bedroom?  Use night lights.  Make sure that you have a light by your bed that is easy to reach.  Do not use any sheets or blankets that are too big for your bed. They should not hang down onto the floor.  Have a firm chair that has side arms. You can use this for support while you get dressed.  Do not have throw rugs and other things on the floor that can make you trip. What can I do in the kitchen?  Clean up any spills right away.  Avoid walking on wet floors.  Keep items that you use a lot in easy-to-reach places.  If you need to reach something above you, use a strong step stool that has a grab bar.  Keep electrical cords out of the way.  Do not use floor polish or wax that makes floors slippery. If you  must use wax, use non-skid floor wax.  Do not have throw rugs and other things on the floor that can make you trip. What can I do with my stairs?  Do not leave any items on the stairs.  Make sure that there are handrails on both sides of the stairs and use them. Fix handrails that are broken or loose. Make sure that handrails are as long as the stairways.  Check any carpeting to make sure that it is firmly attached to the stairs. Fix any carpet that is loose or worn.  Avoid having throw rugs at the top or bottom of the stairs. If you do have throw rugs, attach them to the floor with carpet tape.  Make sure that you have a light switch at the top of the stairs and the bottom of the stairs. If you do not have them, ask someone to add them for you. What else can I do to help prevent falls?  Wear shoes that:  Do not have high heels.  Have rubber bottoms.  Are comfortable and fit you well.  Are closed at the toe. Do not wear sandals.  If you  use a stepladder:  Make sure that it is fully opened. Do not climb a closed stepladder.  Make sure that both sides of the stepladder are locked into place.  Ask someone to hold it for you, if possible.  Clearly mark and make sure that you can see:  Any grab bars or handrails.  First and last steps.  Where the edge of each step is.  Use tools that help you move around (mobility aids) if they are needed. These include:  Canes.  Walkers.  Scooters.  Crutches.  Turn on the lights when you go into a dark area. Replace any light bulbs as soon as they burn out.  Set up your furniture so you have a clear path. Avoid moving your furniture around.  If any of your floors are uneven, fix them.  If there are any pets around you, be aware of where they are.  Review your medicines with your doctor. Some medicines can make you feel dizzy. This can increase your chance of falling. Ask your doctor what other things that you can do to help prevent falls. This information is not intended to replace advice given to you by your health care provider. Make sure you discuss any questions you have with your health care provider. Document Released: 03/01/2009 Document Revised: 10/11/2015 Document Reviewed: 06/09/2014 Elsevier Interactive Patient Education  2017 [employer].

## 2020-08-15 DIAGNOSIS — L820 Inflamed seborrheic keratosis: Secondary | ICD-10-CM | POA: Diagnosis not present

## 2020-08-15 DIAGNOSIS — L821 Other seborrheic keratosis: Secondary | ICD-10-CM | POA: Diagnosis not present

## 2020-08-15 DIAGNOSIS — Z85828 Personal history of other malignant neoplasm of skin: Secondary | ICD-10-CM | POA: Diagnosis not present

## 2020-08-15 DIAGNOSIS — I8312 Varicose veins of left lower extremity with inflammation: Secondary | ICD-10-CM | POA: Diagnosis not present

## 2020-09-24 DIAGNOSIS — G4733 Obstructive sleep apnea (adult) (pediatric): Secondary | ICD-10-CM | POA: Diagnosis not present

## 2020-12-24 DIAGNOSIS — G4733 Obstructive sleep apnea (adult) (pediatric): Secondary | ICD-10-CM | POA: Diagnosis not present

## 2021-02-11 DIAGNOSIS — L821 Other seborrheic keratosis: Secondary | ICD-10-CM | POA: Diagnosis not present

## 2021-02-11 DIAGNOSIS — D225 Melanocytic nevi of trunk: Secondary | ICD-10-CM | POA: Diagnosis not present

## 2021-02-11 DIAGNOSIS — I872 Venous insufficiency (chronic) (peripheral): Secondary | ICD-10-CM | POA: Diagnosis not present

## 2021-02-11 DIAGNOSIS — L814 Other melanin hyperpigmentation: Secondary | ICD-10-CM | POA: Diagnosis not present

## 2021-02-28 DIAGNOSIS — E538 Deficiency of other specified B group vitamins: Secondary | ICD-10-CM | POA: Diagnosis not present

## 2021-02-28 DIAGNOSIS — Z125 Encounter for screening for malignant neoplasm of prostate: Secondary | ICD-10-CM | POA: Diagnosis not present

## 2021-02-28 DIAGNOSIS — K219 Gastro-esophageal reflux disease without esophagitis: Secondary | ICD-10-CM | POA: Diagnosis not present

## 2021-02-28 DIAGNOSIS — Z23 Encounter for immunization: Secondary | ICD-10-CM | POA: Diagnosis not present

## 2021-03-26 DIAGNOSIS — G4733 Obstructive sleep apnea (adult) (pediatric): Secondary | ICD-10-CM | POA: Diagnosis not present

## 2021-04-25 DIAGNOSIS — G4733 Obstructive sleep apnea (adult) (pediatric): Secondary | ICD-10-CM | POA: Diagnosis not present

## 2021-05-26 DIAGNOSIS — G4733 Obstructive sleep apnea (adult) (pediatric): Secondary | ICD-10-CM | POA: Diagnosis not present

## 2021-07-10 DIAGNOSIS — G4733 Obstructive sleep apnea (adult) (pediatric): Secondary | ICD-10-CM | POA: Diagnosis not present

## 2021-08-07 DIAGNOSIS — G4733 Obstructive sleep apnea (adult) (pediatric): Secondary | ICD-10-CM | POA: Diagnosis not present

## 2021-08-20 DIAGNOSIS — L812 Freckles: Secondary | ICD-10-CM | POA: Diagnosis not present

## 2021-08-20 DIAGNOSIS — C44519 Basal cell carcinoma of skin of other part of trunk: Secondary | ICD-10-CM | POA: Diagnosis not present

## 2021-08-20 DIAGNOSIS — C44311 Basal cell carcinoma of skin of nose: Secondary | ICD-10-CM | POA: Diagnosis not present

## 2021-08-20 DIAGNOSIS — Z85828 Personal history of other malignant neoplasm of skin: Secondary | ICD-10-CM | POA: Diagnosis not present

## 2021-08-20 DIAGNOSIS — L821 Other seborrheic keratosis: Secondary | ICD-10-CM | POA: Diagnosis not present

## 2021-08-20 DIAGNOSIS — D1801 Hemangioma of skin and subcutaneous tissue: Secondary | ICD-10-CM | POA: Diagnosis not present

## 2021-09-04 DIAGNOSIS — Z0000 Encounter for general adult medical examination without abnormal findings: Secondary | ICD-10-CM | POA: Diagnosis not present

## 2021-09-04 DIAGNOSIS — Z0183 Encounter for blood typing: Secondary | ICD-10-CM | POA: Diagnosis not present

## 2021-09-04 DIAGNOSIS — E781 Pure hyperglyceridemia: Secondary | ICD-10-CM | POA: Diagnosis not present

## 2021-09-07 DIAGNOSIS — G4733 Obstructive sleep apnea (adult) (pediatric): Secondary | ICD-10-CM | POA: Diagnosis not present

## 2021-10-01 DIAGNOSIS — Z85828 Personal history of other malignant neoplasm of skin: Secondary | ICD-10-CM | POA: Diagnosis not present

## 2021-10-01 DIAGNOSIS — C44311 Basal cell carcinoma of skin of nose: Secondary | ICD-10-CM | POA: Diagnosis not present

## 2021-10-08 DIAGNOSIS — Z4802 Encounter for removal of sutures: Secondary | ICD-10-CM | POA: Diagnosis not present

## 2021-10-08 DIAGNOSIS — G4733 Obstructive sleep apnea (adult) (pediatric): Secondary | ICD-10-CM | POA: Diagnosis not present

## 2021-11-08 DIAGNOSIS — G4733 Obstructive sleep apnea (adult) (pediatric): Secondary | ICD-10-CM | POA: Diagnosis not present

## 2021-12-08 DIAGNOSIS — G4733 Obstructive sleep apnea (adult) (pediatric): Secondary | ICD-10-CM | POA: Diagnosis not present

## 2022-01-14 DIAGNOSIS — G4733 Obstructive sleep apnea (adult) (pediatric): Secondary | ICD-10-CM | POA: Diagnosis not present

## 2022-03-07 DIAGNOSIS — Z23 Encounter for immunization: Secondary | ICD-10-CM | POA: Diagnosis not present

## 2022-04-14 DIAGNOSIS — G4733 Obstructive sleep apnea (adult) (pediatric): Secondary | ICD-10-CM | POA: Diagnosis not present

## 2022-04-16 DIAGNOSIS — G4733 Obstructive sleep apnea (adult) (pediatric): Secondary | ICD-10-CM | POA: Diagnosis not present

## 2022-04-22 ENCOUNTER — Telehealth: Payer: Self-pay | Admitting: Family Medicine

## 2022-04-22 NOTE — Telephone Encounter (Signed)
Left message for patient to call back and schedule Medicare Annual Wellness Visit (AWV) either virtually or phone . Left  my Christopher Blake number [phone]   Last AWV 07/30/20  please schedule with Jarrett Soho kim   45 min for awv-i and in office appointments 30 min for awv-s  phone/virtual appointments

## 2022-05-14 DIAGNOSIS — G4733 Obstructive sleep apnea (adult) (pediatric): Secondary | ICD-10-CM | POA: Diagnosis not present

## 2022-06-14 DIAGNOSIS — G4733 Obstructive sleep apnea (adult) (pediatric): Secondary | ICD-10-CM | POA: Diagnosis not present

## 2022-07-07 DIAGNOSIS — C44719 Basal cell carcinoma of skin of left lower limb, including hip: Secondary | ICD-10-CM | POA: Diagnosis not present

## 2022-07-07 DIAGNOSIS — L578 Other skin changes due to chronic exposure to nonionizing radiation: Secondary | ICD-10-CM | POA: Diagnosis not present

## 2022-07-07 DIAGNOSIS — L570 Actinic keratosis: Secondary | ICD-10-CM | POA: Diagnosis not present

## 2022-07-07 DIAGNOSIS — C44619 Basal cell carcinoma of skin of left upper limb, including shoulder: Secondary | ICD-10-CM | POA: Diagnosis not present

## 2022-07-07 DIAGNOSIS — C44612 Basal cell carcinoma of skin of right upper limb, including shoulder: Secondary | ICD-10-CM | POA: Diagnosis not present

## 2022-07-16 DIAGNOSIS — G4733 Obstructive sleep apnea (adult) (pediatric): Secondary | ICD-10-CM | POA: Diagnosis not present

## 2022-09-02 DIAGNOSIS — C44719 Basal cell carcinoma of skin of left lower limb, including hip: Secondary | ICD-10-CM | POA: Diagnosis not present

## 2022-09-03 DIAGNOSIS — C44612 Basal cell carcinoma of skin of right upper limb, including shoulder: Secondary | ICD-10-CM | POA: Diagnosis not present

## 2022-09-05 DIAGNOSIS — R600 Localized edema: Secondary | ICD-10-CM | POA: Diagnosis not present

## 2022-09-05 DIAGNOSIS — M7989 Other specified soft tissue disorders: Secondary | ICD-10-CM | POA: Diagnosis not present

## 2022-09-05 DIAGNOSIS — M70962 Unspecified soft tissue disorder related to use, overuse and pressure, left lower leg: Secondary | ICD-10-CM | POA: Diagnosis not present

## 2022-09-05 DIAGNOSIS — M79605 Pain in left leg: Secondary | ICD-10-CM | POA: Diagnosis not present

## 2022-09-09 DIAGNOSIS — Z0000 Encounter for general adult medical examination without abnormal findings: Secondary | ICD-10-CM | POA: Diagnosis not present

## 2022-09-09 DIAGNOSIS — Z125 Encounter for screening for malignant neoplasm of prostate: Secondary | ICD-10-CM | POA: Diagnosis not present

## 2022-09-09 DIAGNOSIS — Z1330 Encounter for screening examination for mental health and behavioral disorders, unspecified: Secondary | ICD-10-CM | POA: Diagnosis not present

## 2022-09-09 DIAGNOSIS — Z6841 Body Mass Index (BMI) 40.0 and over, adult: Secondary | ICD-10-CM | POA: Diagnosis not present

## 2022-09-09 DIAGNOSIS — E781 Pure hyperglyceridemia: Secondary | ICD-10-CM | POA: Diagnosis not present

## 2022-09-09 DIAGNOSIS — G4733 Obstructive sleep apnea (adult) (pediatric): Secondary | ICD-10-CM | POA: Diagnosis not present

## 2022-09-10 DIAGNOSIS — C44619 Basal cell carcinoma of skin of left upper limb, including shoulder: Secondary | ICD-10-CM | POA: Diagnosis not present

## 2022-09-24 DIAGNOSIS — Z48817 Encounter for surgical aftercare following surgery on the skin and subcutaneous tissue: Secondary | ICD-10-CM | POA: Diagnosis not present

## 2022-10-08 DIAGNOSIS — Z48817 Encounter for surgical aftercare following surgery on the skin and subcutaneous tissue: Secondary | ICD-10-CM | POA: Diagnosis not present

## 2022-10-14 DIAGNOSIS — G4733 Obstructive sleep apnea (adult) (pediatric): Secondary | ICD-10-CM | POA: Diagnosis not present

## 2022-10-20 ENCOUNTER — Encounter: Payer: Self-pay | Admitting: Gastroenterology
